# Patient Record
Sex: Female | Born: 1955
Health system: Southern US, Community
[De-identification: ages and names within clinical notes are randomized; demographics above are authoritative.]

## PROBLEM LIST (undated history)

## (undated) DIAGNOSIS — R87619 Unspecified abnormal cytological findings in specimens from cervix uteri: Secondary | ICD-10-CM

## (undated) DIAGNOSIS — G4700 Insomnia, unspecified: Secondary | ICD-10-CM

## (undated) HISTORY — DX: Unspecified abnormal cytological findings in specimens from cervix uteri: R87.619

## (undated) HISTORY — DX: Insomnia, unspecified: G47.00

## (undated) HISTORY — PX: COLPOSCOPY: SHX161

## (undated) HISTORY — PX: GYNECOLOGIC CRYOSURGERY: SHX857

---

## 2000-08-01 ENCOUNTER — Ambulatory Visit (HOSPITAL_BASED_OUTPATIENT_CLINIC_OR_DEPARTMENT_OTHER): Admission: RE | Admit: 2000-08-01 | Discharge: 2000-08-01 | Payer: Self-pay | Admitting: Orthopedic Surgery

## 2001-08-20 ENCOUNTER — Other Ambulatory Visit: Admission: RE | Admit: 2001-08-20 | Discharge: 2001-08-20 | Payer: Self-pay | Admitting: Obstetrics and Gynecology

## 2001-09-18 ENCOUNTER — Ambulatory Visit (HOSPITAL_COMMUNITY): Admission: RE | Admit: 2001-09-18 | Discharge: 2001-09-18 | Payer: Self-pay | Admitting: Obstetrics and Gynecology

## 2001-09-18 ENCOUNTER — Encounter (INDEPENDENT_AMBULATORY_CARE_PROVIDER_SITE_OTHER): Payer: Self-pay

## 2005-08-25 ENCOUNTER — Ambulatory Visit (HOSPITAL_COMMUNITY): Admission: RE | Admit: 2005-08-25 | Discharge: 2005-08-25 | Payer: Self-pay | Admitting: Family Medicine

## 2008-10-13 HISTORY — PX: LAPAROSCOPIC ASSISTED VAGINAL HYSTERECTOMY: SHX5398

## 2009-03-24 ENCOUNTER — Emergency Department (HOSPITAL_COMMUNITY): Admission: EM | Admit: 2009-03-24 | Discharge: 2009-03-24 | Payer: Self-pay | Admitting: Emergency Medicine

## 2009-09-16 ENCOUNTER — Ambulatory Visit (HOSPITAL_COMMUNITY): Admission: RE | Admit: 2009-09-16 | Discharge: 2009-09-16 | Payer: Self-pay | Admitting: Internal Medicine

## 2010-03-31 ENCOUNTER — Encounter: Admission: RE | Admit: 2010-03-31 | Discharge: 2010-03-31 | Payer: Self-pay | Admitting: Unknown Physician Specialty

## 2010-03-31 HISTORY — PX: BREAST BIOPSY: SHX20

## 2010-06-11 IMAGING — CT CT CHEST W/ CM
2 of 3 series · 15 of 36 positions shown, 18 images · IV contrast (Omnipaque 300)
Comparison: None

CLINICAL DATA: Right sternoclavicular region mass.

CT CHEST WITH CONTRAST
TECHNIQUE: Multidetector CT imaging of the chest was performed
following the standard protocol during bolus administration of
intravenous contrast.
Contrast: 80 ml Hmnipaque-611

[Series 2: chestroutine 5.0 b40f · axial · 0.60mm/px · z∈[-350,-110]mm · 12 of 58 slices shown, 15 images]
[im 5/58  mediastinal]
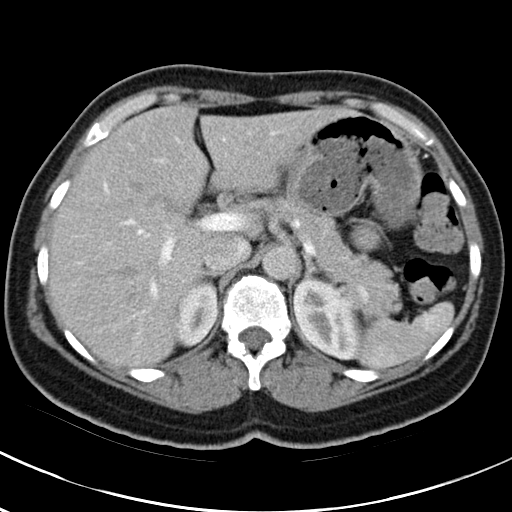
[im 5/58  lung]
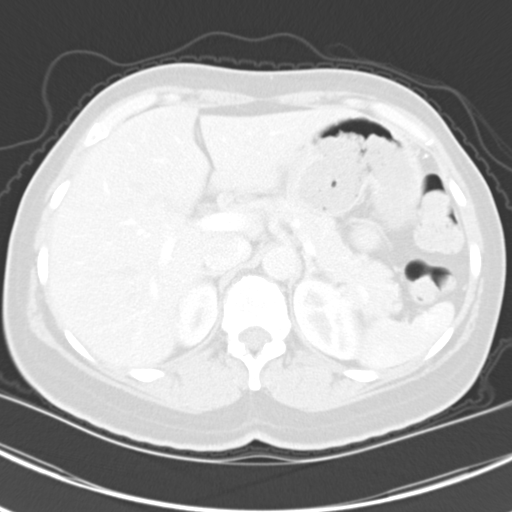
[im 9/58  lung]
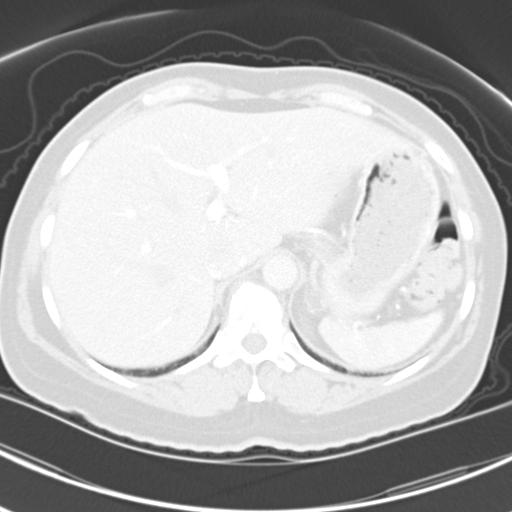
[im 13/58  lung]
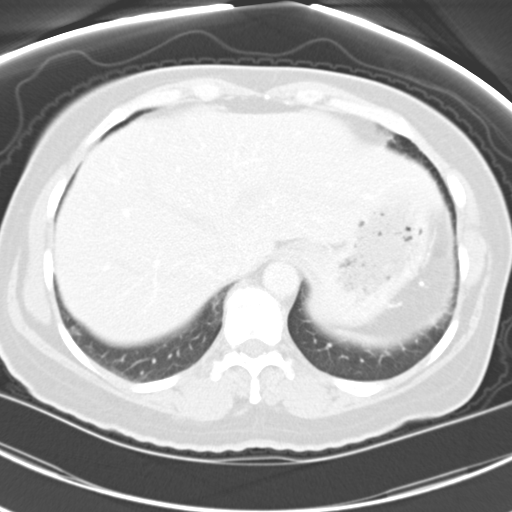
[im 17/58  lung]
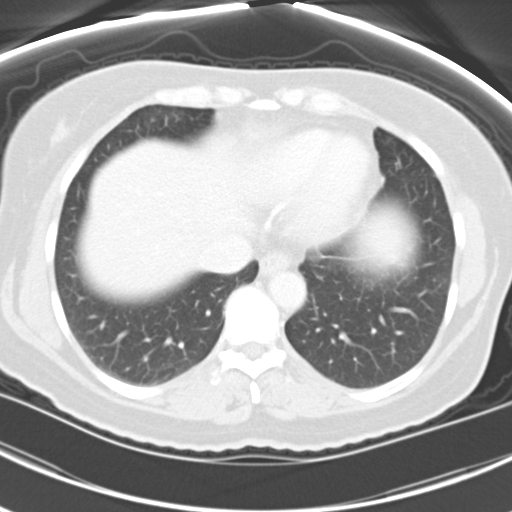
[im 22/58  mediastinal]
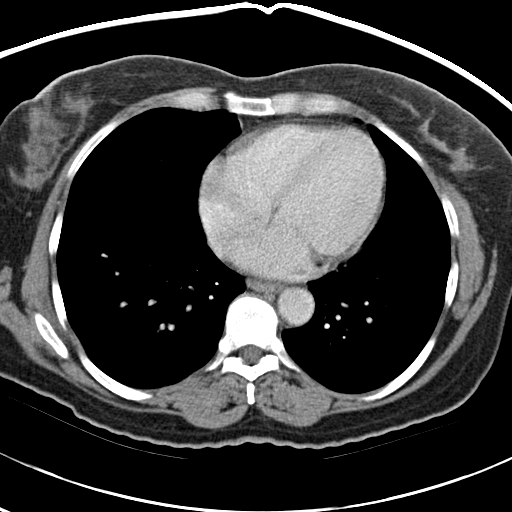
[im 22/58  lung]
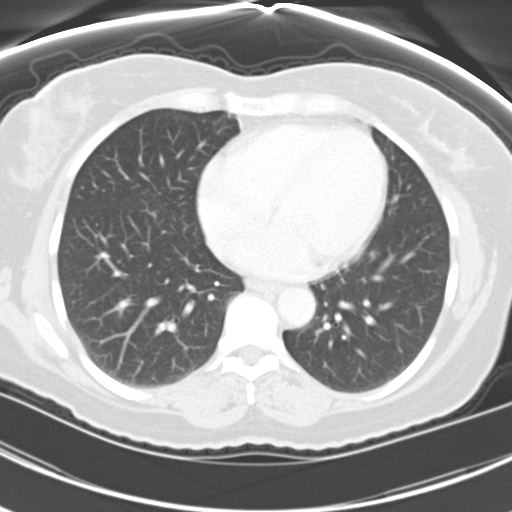
[im 26/58  lung]
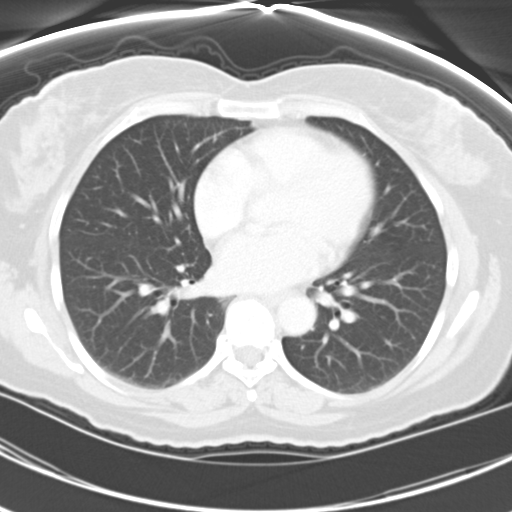
[im 32/58  lung]
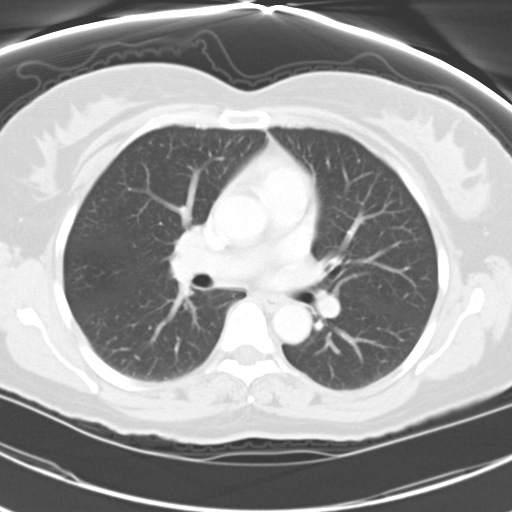
[im 36/58  lung]
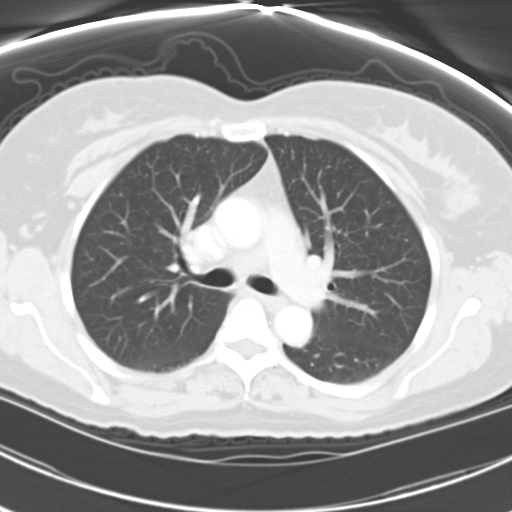
[im 41/58  mediastinal]
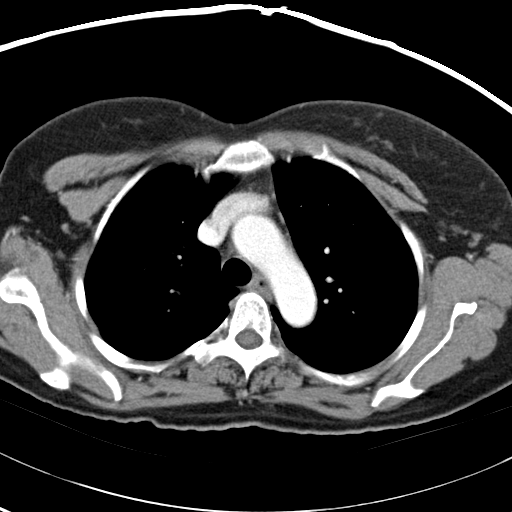
[im 41/58  lung]
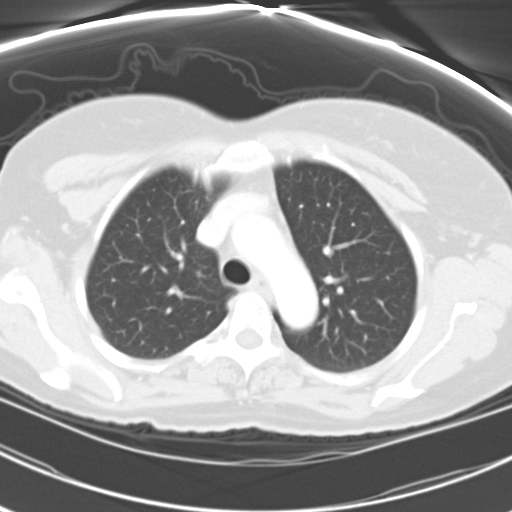
[im 45/58  lung]
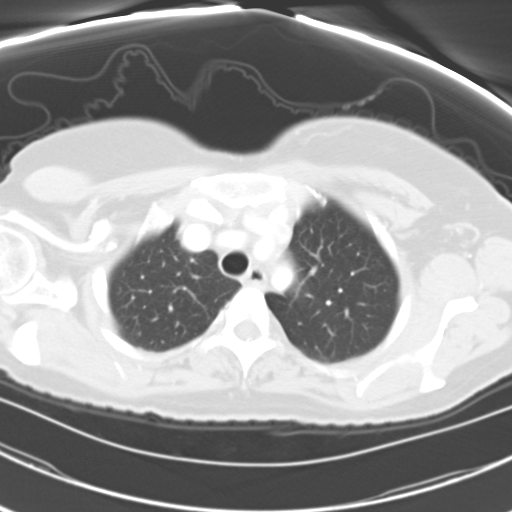
[im 49/58  lung]
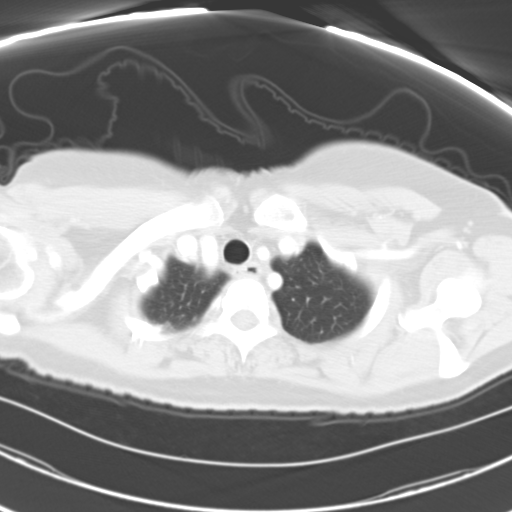
[im 53/58  lung]
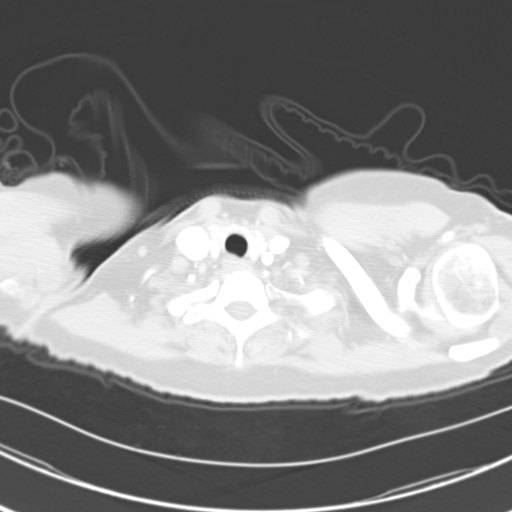

[Series 4: mpr coronal chest 3mm · coronal · 0.54mm/px · 3 of 71 slices shown]
[im 15/71  lung]
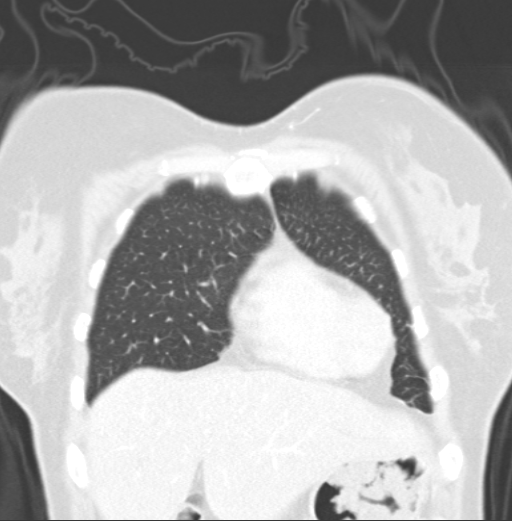
[im 29/71  lung]
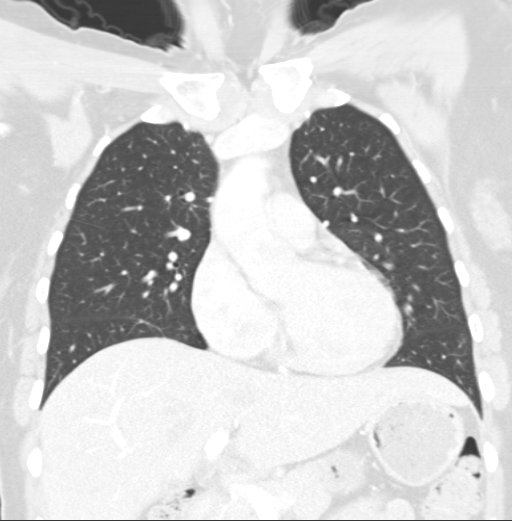
[im 43/71  lung]
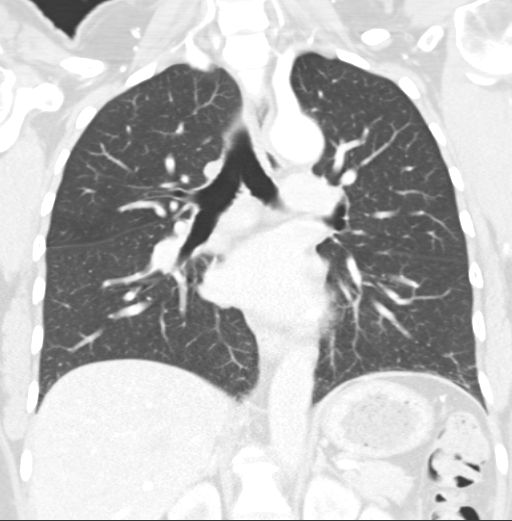

[15 of 36 positions shown; findings below may reference images not displayed]

FINDINGS: There is no pleural or pericardial fluid.  There is no
mediastinal or hilar mass or adenopathy.  The lung parenchyma is
clear.  Upper abdominal structures appear normal.

I do not see a any asymmetry or pathologic finding at the
sternoclavicular joints.  Clavicles themselves appear normal.  I do
not see any hypertrophic change of the joints.  No regional soft
tissue abnormalities are discernible.
IMPRESSION: Normal examination.  I cannot see any abnormality by imaging
relating to the sternoclavicular joints.  They appear symmetric and
grossly within normal limits.

## 2011-01-11 LAB — CREATININE, SERUM
Creatinine, Ser: 0.67 mg/dL (ref 0.4–1.2)
GFR calc Af Amer: >60 mL/min (ref >60)
GFR calc non Af Amer: >60 mL/min (ref >60)

## 2011-02-25 NOTE — Op Note (Signed)
[place]  Patient:    Cindy Noble, Cindy Noble                          MRN: [id number] Proc. Date: 08/01/00 Adm. Date:  [id number] Attending:  Twana First                           Operative Report  PREOPERATIVE DIAGNOSES: 1. Right shoulder adhesive capsulitis. 2. Right shoulder rotator cuff tendinitis and impingement.  POSTOPERATIVE DIAGNOSES: 1. Right shoulder adhesive capsulitis. 2. Right shoulder rotator cuff tendinitis and impingement.  PROCEDURE: 1. Right shoulder EUA followed by manipulation. 2. Right shoulder arthroscopic lysis of adhesions. 3. Right shoulder subacromial decompression.  SURGEON:  Elana Alm. Thurston Hole, M.D.  ASSISTANT:  ______ P.A.  ANESTHESIA:  General.  OPERATIVE TIME:  30 minutes.  COMPLICATIONS:  None.  INDICATIONS FOR PROCEDURE:  Cindy Noble is a 55 year old woman who has had 5 to 6 months of increasing right shoulder pain and stiffness with adhesive capsulitis and tendinitis who has failed conservative care, and is now to undergo EUM manipulation and arthroscopy.  DESCRIPTION OF PROCEDURE:  Cindy Noble was brought to the operating room on August 01, 2000.  Placed on the operating room table in supine position after a supraclavicular block had been placed in the holding room.  After being placed under general anesthesia, her right shoulder was examined under anesthesia.  Initial range of motion showed forward flexion 150, abduction to 110, internal and external rotation of 50 degrees.  A gentle manipulation was carried out, breaking up soft adhesions and improving flexion to 170, abduction to 170, internal and external rotation of 85 to 90 degrees.  Her shoulder remained stable to ligamentous exam.  After this was done she was placed in a beach chair position, and her shoulder and arm was prepped using sterile Betadine and draped using sterile technique.  Originally, through a posterior arthroscopic portal the  arthroscope with a pump attached was placed into an anterior portal, and arthroscopic probe was placed.  On initial inspection the articular cartilage and the glenohumeral joint was found to be normal.  Anterior labrum was intact.  Anterior and inferior labrum, and anterior inferior glenohumeral ligaments were intact.  Superior labrum and biceps tendon anchor was intact.  Biceps tendon was intact.  There was moderate hemorrhagic synovitis which was debrided anteriorly.  The posterior labrum was intact.  The inferior capsular recess had hemorrhagic synovitis secondary to manipulation, but no loose bodies.  The rotator cuff was thoroughly inspected, and there was found to be no evidence of a tear.  The subacromial space was entered.  Moderately thickened bursitis was resected. The rotator cuff underneath this was frayed and moderately edematous, but no evidence of a tear.  Subacromial decompression was carried out, removing 6 to 8 mm of the undersurface of the anterior, anterolateral, and anteromedial acromion, and CA ligament release carried out.  After this was done, the shoulder could be brought through a full range of motion with no impingement on the rotator cuff.  At this point it was felt that all pathology had been satisfactorily addressed.  The instruments were removed.  Portals were closed with 3-0 Nylon suture and injected with 0.25% Marcaine with epinephrine. Sterile dressings were applied and sling.  The patient was awakened and taken to the recovery room in stable condition.  FOLLOWUP CARE:  Cindy Noble will be followed as  an outpatient on Vicodin and Celebrex.  Begin early aggressive physical therapy.  See her back in the office in a week for sutures out and follow-up.DD:  08/01/00 TD:  08/01/00 Job: [id number]

## 2011-02-25 NOTE — H&P (Signed)
[facility]  Patient:    Cindy Noble, Cindy Noble Visit Number: [id number] MRN: [id number]          Service Type: Attending:  Esmeralda Arthur, M.D. Dictated by:   Esmeralda Arthur, M.D.                           History and Physical  HISTORY OF PRESENT ILLNESS:   This is a 55 year old, para 1-0-1, who is admitted to the hospital for hysteroscopy, D&C, and possible resection.  She was seen on August 20, 2001, for a checkup.  She was in good health and she is taking Ortho-Cyclen.  Because she is taking birth control pills, she had an ultrasound and she had a finding that her uterus was 5.1 x 3.6 x 4 cm with the endometrium 6 mm in thickness with a polyp which was outlined by fluid, 6 x 2 mm.  Her right ovary was 2.7 x 1.8 x 1.7 with a simple cyst, 9 x 9 mm. The left ovary was 1.4 x 1 x 1.5.  Because of the polyps, she elected to have the hysteroscopy which we are doing today.                                Her last Papanicolaou smear was on August 24, 2001, and was normal.  She has had previous dysplasia of the cervix and, in 1983, had cryosurgery.                                Her last menstrual period was March 2002.  She does not smoke.  She uses alcohol occasionally.  She rarely uses caffeine. She does take Ortho-Cyclen birth control pill.  She is on Caltrate.  REVIEW OF SYSTEMS:            Negative except she has some arthritis.  PAST SURGICAL HISTORY:        She had a C section.  She has had right shoulder surgery.  FAMILY HISTORY:               She has two aunts with breast cancer.  She had a maternal aunt with ovarian cancer, and her father had hypertension.  She has no problems.  PAST PERSONAL HISTORY:        Negative.  PHYSICAL EXAMINATION:  GENERAL:                      A well-developed, well-nourished female who is oriented and alert.  VITAL SIGNS:                  Blood pressure 104/74, weight 137.  Her height is is 5 feet, 1  inch.  HEART:                        Normal sinus rhythm without any gallops or murmurs.  LUNGS:                        Clear to percussion and auscultation.  BREASTS:                      Type 4 without masses.  ABDOMEN:  The liver and spleen are not palpated.  The abdomen is flat.  GENITOURINARY:                External genitalia within normal limits.  Cervix looks fine, is epithelialized.  The uterus feels slightly enlarged and the ovaries are not palpable.  Rectovaginal confirms.  IMPRESSION:                   Perimenopausal on hormone replacement therapy, endometrial polyp, endometrial fluid, dysplasia of the cervix in 1983 which was treated with cryosurgery, two maternal aunts with breast cancer, one maternal aunt with ovarian cancer.  DISPOSITION:                  Admit to the hospital for hysteroscopy and dilation and curettage. Dictated by:   Esmeralda Arthur, M.D. Attending:  Esmeralda Arthur, M.D. DD:  09/17/01 TD:  09/17/01 Job: [phone] [id number]

## 2011-02-25 NOTE — Op Note (Signed)
[facility]  Patient:    Cindy Noble, Cindy Noble Visit Number: [id number] MRN: [id number]          Service Type: DSU Location: [facility] Attending Physician:  Amanda Cockayne Dictated by:   Esmeralda Arthur, M.D. Proc. Date: 09/18/01 Admit Date:  09/18/2001                             Operative Report  PREOPERATIVE DIAGNOSIS:       Cervical stenosis, endometrial polyp, fluid in endometrial cavity.  POSTOPERATIVE DIAGNOSIS:      Cervical stenosis, endometrial polyp, fluid in endometrial cavity. Plus perforation of the uterus.  OPERATION:  SURGEON:                      Esmeralda Arthur, M.D.  ASSISTANT:  ANESTHESIA:                   General anesthesia.  PACKS:                        None.  CATHETERS:                    None.  MEDIUM:                       Sorbitol; deficit 140 cc.  FINDINGS:                     She had marked cervical stenosis which had to be dilated with the hemostat.  Once we dilated, we then sounded the uterus and on dilating the cervix, I felt we perforated the uterus.  DESCRIPTION OF PROCEDURE:  We dilated through the observation scope and then inserted the observation scope and I could not see a perforation, but I am sure we had one because of the fluid loss.  She had no large endometrial polyps.  She had several small ones in the posterior wall and anterior wall. I could see both tubal openings.  We then did a curettage with the smooth curet and removed the endometrial polyp on the left side, probably a polyp on the anterior side and these were small.  We then stopped the procedure and did not look again.  We watched her for a couple of minutes and she had no excessive bleeding.  The procedure was terminated and she was carried to the recovery room in good condition. Dictated by:   Esmeralda Arthur, M.D. Attending Physician:  Amanda Cockayne DD:  09/18/01 TD:  09/18/01 Job: [id number]

## 2014-01-08 DEATH — deceased

## 2016-11-28 ENCOUNTER — Encounter: Payer: Self-pay | Admitting: Nurse Practitioner

## 2016-11-28 NOTE — Progress Notes (Deleted)
Patient ID: Cindy Noble, female   DOB: 04/09/1956, 61 y.o.   MRN: [id number]  61 y.o. G1P1001. Married  Caucasian Fe here for [employer] annual exam.  Prior pt of Dr. Winfred BurnSig Smith.  She had TVH 10/12/2006 due to abnormal pap - does not recall the exact pathology.  With hysterectomy took Premarin X 1 year - ended about 2009. No vaso symptoms. Some vagina dryness. Sleep patterns have always been disrupted.  Cant go to sleep.  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  Married for 25 yrs, son died at age 62 in MVA.   Lapse of GYN care.  She does get labs at work.  Patient's last menstrual period was 10/10/2006 (approximate).          Sexually active: Yes.    The current method of family planning is status post hysterectomy and post menopausal status.    Exercising: No.  The patient does not participate in regular exercise at present. Smoker:  no  Health Maintenance: Pap:  Prior to 2008 - will do today MMG: 08/2014, normal per patient, normal since biopsy in 2011 - will get  scheduled Colonoscopy: Never - will get scheduled BMD: Never - will get scheduled TDaP:  > 10 years - will update Shingles: Never - will get at pharmacy Pneumonia: Not indicated due to age Hep C and HIV: blood donation 121/20/17 Labs: Wellness Program, within normal parameters  10/12017   reports that she has never smoked. She has never used smokeless tobacco. She reports that she drinks about 0.6 oz of alcohol per week . She reports that she does not use drugs.  Past Medical History:  Diagnosis Date  . Insomnia     Past Surgical History:  Procedure Laterality Date  . BREAST BIOPSY Left 03/31/2010    core biopsy:  fibrocystic changes with adenosis and stromal fibrosis  . CESAREAN SECTION  01/13/1972  . COLPOSCOPY  mid 80's   dysplasia, pap every 6 months for years per patient  . GYNECOLOGIC CRYOSURGERY  mid 80's  . LAPAROSCOPIC ASSISTED VAGINAL HYSTERECTOMY  2008   ovaries remain,  secondary to abnormal pap  "suspicious cells"    No current outpatient prescriptions on file.   No current facility-administered medications for this visit.     Family History  Problem Relation Age of Onset  . Aneurysm Mother   . Diabetes Father   . Hyperlipidemia Father   . Heart attack Father   . Heart disease Father   . Ovarian cancer Maternal Aunt   . Diabetes Paternal Aunt   . Aneurysm Maternal Grandfather   . Diabetes Paternal Grandmother   . Heart attack Paternal Grandmother   . Breast cancer Maternal Aunt   . Breast cancer Maternal Aunt   . Aneurysm Maternal Aunt   . Diabetes Sister   . Hyperlipidemia Sister     ROS:  Pertinent items are noted in HPI.  Otherwise, a comprehensive ROS was negative.  Exam:   BP 118/76 (BP Location: Right Arm, Patient Position: Sitting, Cuff Size: Normal)   Pulse 80   Ht 5\' 1"  (1.549 m)   Wt 148 lb (67.1 kg)   LMP 10/10/2006 (Approximate)   BMI 27.96 kg/m  Height: 5\' 1"  (154.9 cm) Ht Readings from Last 3 Encounters:  11/29/16 5\' 1"  (1.549 m)    General appearance: alert, cooperative and appears stated age Head: Normocephalic, without obvious abnormality, atraumatic Neck: no adenopathy, supple, symmetrical, trachea midline and thyroid normal to inspection  and palpation Lungs: clear to auscultation bilaterally Breasts: normal appearance, no masses or tenderness, very dense Heart: regular rate and rhythm Abdomen: soft, non-tender; no masses,  no organomegaly Extremities: extremities normal, atraumatic, no cyanosis or edema Skin: Skin color, texture, turgor normal. No rashes or lesions. 2 areas on chest wall that need to be evaluated by dermatology. Lymph nodes: Cervical, supraclavicular, and axillary nodes normal. No abnormal inguinal nodes palpated Neurologic: Grossly normal   Pelvic: External genitalia:  no lesions              Urethra:  normal appearing urethra with no masses, tenderness or lesions              Bartholin's and Skene's: normal                  Vagina: normal appearing vagina with normal color and discharge, no lesions              Cervix: absent              Pap taken: Yes.   Bimanual Exam:  Uterus:  uterus absent              Adnexa: no mass, fullness, tenderness               Rectovaginal: Confirms               Anus:  normal sphincter tone, no lesions  Chaperone present: yes  A:  Well Woman with normal exam  Lapse of GYN care  S/P TVH 10/12/2006 secondary to abnormal pap  S/P left breast core biopsy 03/31/10 with FCB changes including adenosis and stromal fibrosis.    S/P Endo biopsy with disordered proliferative endo and polyp  Update immunizations  Update of routine screening  Atrophic vaginitis  Skin changes on chest from sun exposure - she will call herself and get apt with Dr. Emily Filbert whom she has seen in the past.  P:   Reviewed health and wellness pertinent to exam  Pap smear was done  Mammogram is due and will schedule along with BMD at [facility]  Will follow with labs  Will try to get pathology from [facility] 2008 from [facility]  Counseled on breast self exam, mammography screening, adequate intake of calcium and vitamin D, diet and exercise, Kegel's exercises return annually or prn  An After Visit Summary was printed and given to the patient.

## 2016-11-29 ENCOUNTER — Ambulatory Visit (INDEPENDENT_AMBULATORY_CARE_PROVIDER_SITE_OTHER): Payer: BLUE CROSS/BLUE SHIELD | Admitting: Nurse Practitioner

## 2016-11-29 ENCOUNTER — Encounter: Payer: Self-pay | Admitting: Nurse Practitioner

## 2016-11-29 VITALS — BP 118/76 | HR 80 | Ht 61.0 in | Wt 148.0 lb

## 2016-11-29 DIAGNOSIS — E559 Vitamin D deficiency, unspecified: Secondary | ICD-10-CM | POA: Diagnosis not present

## 2016-11-29 DIAGNOSIS — Z0000 Encounter for general adult medical examination without abnormal findings: Secondary | ICD-10-CM

## 2016-11-29 DIAGNOSIS — Z1211 Encounter for screening for malignant neoplasm of colon: Secondary | ICD-10-CM

## 2016-11-29 DIAGNOSIS — Z1272 Encounter for screening for malignant neoplasm of vagina: Secondary | ICD-10-CM | POA: Diagnosis not present

## 2016-11-29 DIAGNOSIS — Z23 Encounter for immunization: Secondary | ICD-10-CM

## 2016-11-29 DIAGNOSIS — Z1151 Encounter for screening for human papillomavirus (HPV): Secondary | ICD-10-CM | POA: Diagnosis not present

## 2016-11-29 DIAGNOSIS — Z01411 Encounter for gynecological examination (general) (routine) with abnormal findings: Secondary | ICD-10-CM

## 2016-11-29 NOTE — Progress Notes (Signed)
[facility] [place] [phone] ex. [phone] to schedule BMD and 3D MMG while in office. Message left with scheduler to return call to [place] at [phone] for scheduling patient. Advised patient will return call with appointment information, best contact [phone].   [facility]ait[facility]. Patient scheduled for BMD 12/29/16 at 0800, MMG 3/1 at 0730. Have patient arrive 15 min early, bring list of medications for BMD and wear elastic waistband, no metal. Fax order to [phone].  Spoke with patient, advised as seen above. Patient agreeable to date and time.

## 2016-11-29 NOTE — Patient Instructions (Addendum)

## 2016-11-30 LAB — VITAMIN D 25 HYDROXY (VIT D DEFICIENCY, FRACTURES): Vit D, 25-Hydroxy: 31 ng/mL (ref 30–100)

## 2016-12-01 LAB — IPS PAP TEST WITH HPV

## 2016-12-01 NOTE — Progress Notes (Signed)
Encounter reviewed by Dr. Brook Amundson C. Silva.  

## 2016-12-02 ENCOUNTER — Encounter: Payer: Self-pay | Admitting: Nurse Practitioner

## 2016-12-02 NOTE — Progress Notes (Signed)
Patient ID: Cindy Noble, female   DOB: 04/09/1956, 61 y.o.   MRN: [id number]  61 y.o. G1P1001. Married  Caucasian Fe here for [employer] annual exam.  Prior pt of Dr. Winfred BurnSig Smith.  She had TVH 10/12/2006 due to abnormal pap - does not recall the exact pathology.  With hysterectomy took Premarin X 1 year - ended about 2009. No vaso symptoms. Some vagina dryness. Sleep patterns have always been disrupted.  Cant go to sleep.  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  Married for 25 yrs, son died at age 62 in MVA.   Lapse of GYN care.  She does get labs at work.  Patient's last menstrual period was 10/10/2006 (approximate).          Sexually active: Yes.    The current method of family planning is status post hysterectomy and post menopausal status.    Exercising: No.  The patient does not participate in regular exercise at present. Smoker:  no  Health Maintenance: Pap:  Prior to 2008 - will do today MMG: 08/2014, normal per patient, normal since biopsy in 2011 - will get  scheduled Colonoscopy: Never - will get scheduled BMD: Never - will get scheduled TDaP:  > 10 years - will update Shingles: Never - will get at pharmacy Pneumonia: Not indicated due to age Hep C and HIV: blood donation 121/20/17 Labs: Wellness Program, within normal parameters  10/12017   reports that she has never smoked. She has never used smokeless tobacco. She reports that she drinks about 0.6 oz of alcohol per week . She reports that she does not use drugs.  Past Medical History:  Diagnosis Date  . Insomnia     Past Surgical History:  Procedure Laterality Date  . BREAST BIOPSY Left 03/31/2010    core biopsy:  fibrocystic changes with adenosis and stromal fibrosis  . CESAREAN SECTION  01/13/1972  . COLPOSCOPY  mid 80's   dysplasia, pap every 6 months for years per patient  . GYNECOLOGIC CRYOSURGERY  mid 80's  . LAPAROSCOPIC ASSISTED VAGINAL HYSTERECTOMY  2008   ovaries remain,  secondary to abnormal pap  "suspicious cells"    No current outpatient prescriptions on file.   No current facility-administered medications for this visit.     Family History  Problem Relation Age of Onset  . Aneurysm Mother   . Diabetes Father   . Hyperlipidemia Father   . Heart attack Father   . Heart disease Father   . Ovarian cancer Maternal Aunt   . Diabetes Paternal Aunt   . Aneurysm Maternal Grandfather   . Diabetes Paternal Grandmother   . Heart attack Paternal Grandmother   . Breast cancer Maternal Aunt   . Breast cancer Maternal Aunt   . Aneurysm Maternal Aunt   . Diabetes Sister   . Hyperlipidemia Sister     ROS:  Pertinent items are noted in HPI.  Otherwise, a comprehensive ROS was negative.  Exam:   BP 118/76 (BP Location: Right Arm, Patient Position: Sitting, Cuff Size: Normal)   Pulse 80   Ht 5\' 1"  (1.549 m)   Wt 148 lb (67.1 kg)   LMP 10/10/2006 (Approximate)   BMI 27.96 kg/m  Height: 5\' 1"  (154.9 cm) Ht Readings from Last 3 Encounters:  11/29/16 5\' 1"  (1.549 m)    General appearance: alert, cooperative and appears stated age Head: Normocephalic, without obvious abnormality, atraumatic Neck: no adenopathy, supple, symmetrical, trachea midline and thyroid normal to inspection  and palpation Lungs: clear to auscultation bilaterally Breasts: normal appearance, no masses or tenderness, very dense Heart: regular rate and rhythm Abdomen: soft, non-tender; no masses,  no organomegaly Extremities: extremities normal, atraumatic, no cyanosis or edema Skin: Skin color, texture, turgor normal. No rashes or lesions. 2 areas on chest wall that need to be evaluated by dermatology. Lymph nodes: Cervical, supraclavicular, and axillary nodes normal. No abnormal inguinal nodes palpated Neurologic: Grossly normal   Pelvic: External genitalia:  no lesions              Urethra:  normal appearing urethra with no masses, tenderness or lesions              Bartholin's and Skene's: normal                  Vagina: normal appearing vagina with normal color and discharge, no lesions              Cervix: absent              Pap taken: Yes.   Bimanual Exam:  Uterus:  uterus absent              Adnexa: no mass, fullness, tenderness               Rectovaginal: Confirms               Anus:  normal sphincter tone, no lesions  Chaperone present: yes  A:  Well Woman with normal exam  Lapse of GYN care  S/P TVH 10/12/2006 secondary to abnormal pap  S/P left breast core biopsy 03/31/10 with FCB changes including adenosis and stromal fibrosis.    S/P Endo biopsy with disordered proliferative endo and polyp  Update immunizations  Update of routine screening  Atrophic vaginitis  Skin changes on chest from sun exposure - she will call herself and get apt with Dr. Emily Filbert whom she has seen in the past.  P:   Reviewed health and wellness pertinent to exam  Pap smear was done  Mammogram is due and will schedule along with BMD at [facility]  Will follow with labs  Will try to get pathology from [facility] 2008 from [facility]  Counseled on breast self exam, mammography screening, adequate intake of calcium and vitamin D, diet and exercise, Kegel's exercises return annually or prn  An After Visit Summary was printed and given to the patient.

## 2016-12-08 DIAGNOSIS — Z1231 Encounter for screening mammogram for malignant neoplasm of breast: Secondary | ICD-10-CM | POA: Diagnosis not present

## 2017-01-03 DIAGNOSIS — Z1211 Encounter for screening for malignant neoplasm of colon: Secondary | ICD-10-CM | POA: Diagnosis not present

## 2017-01-05 ENCOUNTER — Encounter: Payer: Self-pay | Admitting: Nurse Practitioner

## 2017-01-23 DIAGNOSIS — Z1211 Encounter for screening for malignant neoplasm of colon: Secondary | ICD-10-CM | POA: Diagnosis not present

## 2017-01-23 DIAGNOSIS — K5730 Diverticulosis of large intestine without perforation or abscess without bleeding: Secondary | ICD-10-CM | POA: Diagnosis not present

## 2017-03-23 DIAGNOSIS — E2839 Other primary ovarian failure: Secondary | ICD-10-CM | POA: Diagnosis not present

## 2017-03-23 DIAGNOSIS — M8589 Other specified disorders of bone density and structure, multiple sites: Secondary | ICD-10-CM | POA: Diagnosis not present

## 2017-03-23 DIAGNOSIS — M85851 Other specified disorders of bone density and structure, right thigh: Secondary | ICD-10-CM | POA: Diagnosis not present

## 2017-03-29 ENCOUNTER — Telehealth: Payer: Self-pay | Admitting: Nurse Practitioner

## 2017-03-29 NOTE — Telephone Encounter (Signed)
Please let pt know that BMD done on 03/23/17 shows a T Score at the spine of -1.7; right hip neck at -1.8; left hip neck at -1.6.  The lowest is the right hip but all sites fall in the Osteopenic range.  While some bone loss is normal and expectant in post menopausal women, we do not want to see further loss.  Currently she has no exercise program - she needs to walk at least 3-4 times a week.  She needs to use upper body weights as well. She needs to be on 1000 IU Vit D  daily as previous instructed and maintain good calcium support.  Needs a recheck in 2 yrs.

## 2017-03-30 NOTE — Telephone Encounter (Signed)
Patient returning your call.

## 2017-03-30 NOTE — Telephone Encounter (Signed)
Results discussed in detail with patient.

## 2017-03-30 NOTE — Telephone Encounter (Signed)
Called patient, no answer, left message to call me back at [phone]

## 2017-12-01 ENCOUNTER — Ambulatory Visit: Payer: BLUE CROSS/BLUE SHIELD | Admitting: Certified Nurse Midwife

## 2017-12-22 ENCOUNTER — Ambulatory Visit: Payer: BLUE CROSS/BLUE SHIELD | Admitting: Certified Nurse Midwife

## 2017-12-22 ENCOUNTER — Other Ambulatory Visit: Payer: Self-pay

## 2017-12-22 ENCOUNTER — Encounter: Payer: Self-pay | Admitting: Certified Nurse Midwife

## 2017-12-22 VITALS — BP 108/68 | HR 64 | Resp 16 | Ht 61.0 in | Wt 138.0 lb

## 2017-12-22 DIAGNOSIS — Z01419 Encounter for gynecological examination (general) (routine) without abnormal findings: Secondary | ICD-10-CM

## 2017-12-22 DIAGNOSIS — L578 Other skin changes due to chronic exposure to nonionizing radiation: Secondary | ICD-10-CM

## 2017-12-22 DIAGNOSIS — Z780 Asymptomatic menopausal state: Secondary | ICD-10-CM | POA: Diagnosis not present

## 2017-12-22 DIAGNOSIS — N952 Postmenopausal atrophic vaginitis: Secondary | ICD-10-CM

## 2017-12-22 NOTE — Patient Instructions (Signed)

## 2017-12-22 NOTE — Progress Notes (Signed)
62 y.o. G8P1000 Married  Caucasian Fe here for annual exam. Post menopausal no HRT. Occasional  Vaginal dryness, no issues. All menopausal symptoms resolved. Past history of insomnia, much better. No health issues today. Sees Urgent care if needed. Plans mammogram soon and trip to beach!  Patient's last menstrual period was 10/10/2006 (approximate).          Sexually active: Yes.    The current method of family planning is status post hysterectomy.    Exercising: No.  exercise Smoker:  no  Health Maintenance: Pap:  11-29-16 neg HPV HR neg History of Abnormal Pap: yes MMG:  12-08-16 category c density birads 1:neg.  Self Breast exams: yes Colonoscopy:  3/18  F/u 56yrs per patient BMD:   2018 normal TDaP:  3/18 Shingles: no Pneumonia: no Hep C and HIV: not done Labs: if needed Flu vaccine: no   reports that  has never smoked. she has never used smokeless tobacco. She reports that she drinks alcohol. She reports that she does not use drugs.  Past Medical History:  Diagnosis Date  . Insomnia     Past Surgical History:  Procedure Laterality Date  . BREAST BIOPSY Left 03/31/2010    core biopsy:  fibrocystic changes with adenosis and stromal fibrosis  . CESAREAN SECTION  01/13/1972  . COLPOSCOPY  mid 80's   dysplasia, pap every 6 months for years per patient  . GYNECOLOGIC CRYOSURGERY  mid 80's  . LAPAROSCOPIC ASSISTED VAGINAL HYSTERECTOMY  10/13/2008   ovaries remain,  secondary to hyperplasia with atypia, endo polyp    No current outpatient medications on file.   No current facility-administered medications for this visit.     Family History  Problem Relation Age of Onset  . Aneurysm Mother   . Diabetes Father   . Hyperlipidemia Father   . Heart attack Father   . Heart disease Father   . Ovarian cancer Maternal Aunt   . Diabetes Paternal Aunt   . Aneurysm Maternal Grandfather   . Diabetes Paternal Grandmother   . Heart attack Paternal Grandmother   . Breast cancer  Maternal Aunt   . Breast cancer Maternal Aunt   . Aneurysm Maternal Aunt   . Diabetes Sister   . Hyperlipidemia Sister     ROS:  Pertinent items are noted in HPI.  Otherwise, a comprehensive ROS was negative.  Exam:   BP 108/68   Pulse 64   Resp 16   Ht 5\' 1"  (1.549 m)   Wt 138 lb (62.6 kg)   LMP 10/10/2006 (Approximate)   BMI 26.07 kg/m  Height: 5\' 1"  (154.9 cm) Ht Readings from Last 3 Encounters:  12/22/17 5\' 1"  (1.549 m)  11/29/16 5\' 1"  (1.549 m)    General appearance: alert, cooperative and appears stated age Head: Normocephalic, without obvious abnormality, atraumatic Neck: no adenopathy, supple, symmetrical, trachea midline and thyroid normal to inspection and palpation Lungs: clear to auscultation bilaterally Breasts: normal appearance, no masses or tenderness, No nipple retraction or dimpling, No nipple discharge or bleeding, No axillary or supraclavicular adenopathy Heart: regular rate and rhythm Abdomen: soft, non-tender; no masses,  no organomegaly Extremities: extremities normal, atraumatic, no cyanosis or edema Skin: Skin color, texture, turgor normal. No rashes or lesions Lymph nodes: Cervical, supraclavicular, and axillary nodes normal. No abnormal inguinal nodes palpated Neurologic: Grossly normal   Pelvic: External genitalia:  no lesions              Urethra:  normal appearing urethra with  no masses, tenderness or lesions              Bartholin's and Skene's: normal                 Vagina: normal appearing vagina with normal color and discharge, no lesions              Cervix: absent              Pap taken: No. Bimanual Exam:  Uterus:  uterus absent              Adnexa: normal adnexa and no mass, fullness, tenderness               Rectovaginal: Confirms               Anus:  normal sphincter tone, no lesions  Chaperone present: yes  A:  Well Woman with normal exam  Menopausal no HRT S/P TAH with ovaries retained abnormal pap smear only, no  cancer  Atrophic vaginitis  Mammogram due  Sun damaged skin total body   P:   Reviewed health and wellness pertinent to exam  Discussed atrophic vaginitis and need to use moisture to decrease risk of UTI and irritation. Options discussed and she will decide and call if issues.  Stressed importance of yearly mammogram and SBE  Encouraged to do dermatology visit due to extent of sun damage noted. Given information  Pap smear: no   counseled on breast self exam, mammography screening, feminine hygiene, osteoporosis, adequate intake of calcium and vitamin D, diet and exercise  return annually or prn  An After Visit Summary was printed and given to the patient.

## 2018-02-01 DIAGNOSIS — Z1231 Encounter for screening mammogram for malignant neoplasm of breast: Secondary | ICD-10-CM | POA: Diagnosis not present

## 2018-02-12 ENCOUNTER — Encounter: Payer: Self-pay | Admitting: Certified Nurse Midwife

## 2019-08-30 ENCOUNTER — Other Ambulatory Visit: Payer: Self-pay

## 2019-08-30 DIAGNOSIS — Z20822 Contact with and (suspected) exposure to covid-19: Secondary | ICD-10-CM

## 2019-09-02 LAB — NOVEL CORONAVIRUS, NAA: SARS-CoV-2, NAA: NOT DETECTED

## 2019-12-12 ENCOUNTER — Encounter: Payer: Self-pay | Admitting: Certified Nurse Midwife

## 2019-12-12 DIAGNOSIS — Z1231 Encounter for screening mammogram for malignant neoplasm of breast: Secondary | ICD-10-CM | POA: Diagnosis not present

## 2019-12-14 ENCOUNTER — Ambulatory Visit: Payer: Self-pay | Attending: Internal Medicine

## 2019-12-14 DIAGNOSIS — Z23 Encounter for immunization: Secondary | ICD-10-CM | POA: Insufficient documentation

## 2019-12-14 NOTE — Progress Notes (Signed)
   Covid-19 Vaccination Clinic  Name:  Cindy Noble    MRN: [id number] DOB: 1956/09/29  12/14/2019  [name] was observed post Covid-19 immunization for 15 minutes without incident. She was provided with Vaccine Information Sheet and instruction to access the V-Safe system.   [name] was instructed to call 911 with any severe reactions post vaccine: Marland Kitchen Difficulty breathing  . Swelling of face and throat  . A fast heartbeat  . A bad rash all over body  . Dizziness and weakness   Immunizations Administered    Name Date Dose VIS Date Route   Moderna COVID-19 Vaccine 12/14/2019 11:12 AM 0.5 mL 09/10/2019 Intramuscular   Manufacturer: Moderna   Lot: [id number]   NDC: 37169-678-93

## 2019-12-31 ENCOUNTER — Encounter: Payer: Self-pay | Admitting: Certified Nurse Midwife

## 2020-01-15 ENCOUNTER — Ambulatory Visit: Payer: Self-pay | Attending: Internal Medicine

## 2020-01-15 DIAGNOSIS — Z23 Encounter for immunization: Secondary | ICD-10-CM

## 2020-01-15 NOTE — Progress Notes (Signed)
   Covid-19 Vaccination Clinic  Name:  Cindy Noble    MRN: [id number] DOB: 1955-12-03  01/15/2020  Cindy Noble was observed post Covid-19 immunization for 15 minutes without incident. She was provided with Vaccine Information Sheet and instruction to access the V-Safe system.   Cindy Noble was instructed to call 911 with any severe reactions post vaccine: Marland Kitchen Difficulty breathing  . Swelling of face and throat  . A fast heartbeat  . A bad rash all over body  . Dizziness and weakness   Immunizations Administered    Name Date Dose VIS Date Route   Moderna COVID-19 Vaccine 01/15/2020 10:21 AM 0.5 mL 09/10/2019 Intramuscular   Manufacturer: Gala Murdoch   Lot: [id number]   NDC: 47829-562-13

## 2020-03-23 DIAGNOSIS — B0052 Herpesviral keratitis: Secondary | ICD-10-CM | POA: Diagnosis not present

## 2020-03-24 DIAGNOSIS — B0052 Herpesviral keratitis: Secondary | ICD-10-CM | POA: Diagnosis not present

## 2020-03-27 DIAGNOSIS — B0052 Herpesviral keratitis: Secondary | ICD-10-CM | POA: Diagnosis not present

## 2020-04-03 DIAGNOSIS — B0052 Herpesviral keratitis: Secondary | ICD-10-CM | POA: Diagnosis not present

## 2020-09-17 DIAGNOSIS — Z0189 Encounter for other specified special examinations: Secondary | ICD-10-CM | POA: Diagnosis not present

## 2020-10-05 DIAGNOSIS — E663 Overweight: Secondary | ICD-10-CM | POA: Diagnosis not present

## 2020-10-05 DIAGNOSIS — E559 Vitamin D deficiency, unspecified: Secondary | ICD-10-CM | POA: Diagnosis not present

## 2020-10-08 DIAGNOSIS — E782 Mixed hyperlipidemia: Secondary | ICD-10-CM | POA: Diagnosis not present

## 2021-01-11 DIAGNOSIS — E782 Mixed hyperlipidemia: Secondary | ICD-10-CM | POA: Diagnosis not present

## 2021-03-16 DIAGNOSIS — H5203 Hypermetropia, bilateral: Secondary | ICD-10-CM | POA: Diagnosis not present

## 2021-09-10 DIAGNOSIS — E782 Mixed hyperlipidemia: Secondary | ICD-10-CM | POA: Diagnosis not present

## 2021-09-17 DIAGNOSIS — E782 Mixed hyperlipidemia: Secondary | ICD-10-CM | POA: Diagnosis not present

## 2021-09-17 DIAGNOSIS — Z0001 Encounter for general adult medical examination with abnormal findings: Secondary | ICD-10-CM | POA: Diagnosis not present

## 2021-12-27 DIAGNOSIS — Z1231 Encounter for screening mammogram for malignant neoplasm of breast: Secondary | ICD-10-CM | POA: Diagnosis not present

## 2022-03-15 DIAGNOSIS — D485 Neoplasm of uncertain behavior of skin: Secondary | ICD-10-CM | POA: Diagnosis not present

## 2022-03-15 DIAGNOSIS — C44729 Squamous cell carcinoma of skin of left lower limb, including hip: Secondary | ICD-10-CM | POA: Diagnosis not present

## 2022-03-24 DIAGNOSIS — C44729 Squamous cell carcinoma of skin of left lower limb, including hip: Secondary | ICD-10-CM | POA: Diagnosis not present

## 2022-03-31 DIAGNOSIS — C44729 Squamous cell carcinoma of skin of left lower limb, including hip: Secondary | ICD-10-CM | POA: Diagnosis not present

## 2022-04-07 DIAGNOSIS — C44729 Squamous cell carcinoma of skin of left lower limb, including hip: Secondary | ICD-10-CM | POA: Diagnosis not present

## 2022-04-20 DIAGNOSIS — L821 Other seborrheic keratosis: Secondary | ICD-10-CM | POA: Diagnosis not present

## 2022-04-20 DIAGNOSIS — L578 Other skin changes due to chronic exposure to nonionizing radiation: Secondary | ICD-10-CM | POA: Diagnosis not present

## 2022-04-20 DIAGNOSIS — L814 Other melanin hyperpigmentation: Secondary | ICD-10-CM | POA: Diagnosis not present

## 2022-04-20 DIAGNOSIS — D1801 Hemangioma of skin and subcutaneous tissue: Secondary | ICD-10-CM | POA: Diagnosis not present

## 2022-06-22 DIAGNOSIS — D485 Neoplasm of uncertain behavior of skin: Secondary | ICD-10-CM | POA: Diagnosis not present

## 2022-06-22 DIAGNOSIS — C44729 Squamous cell carcinoma of skin of left lower limb, including hip: Secondary | ICD-10-CM | POA: Diagnosis not present

## 2022-09-12 DIAGNOSIS — Z139 Encounter for screening, unspecified: Secondary | ICD-10-CM | POA: Diagnosis not present

## 2022-09-12 DIAGNOSIS — E782 Mixed hyperlipidemia: Secondary | ICD-10-CM | POA: Diagnosis not present

## 2022-09-14 DIAGNOSIS — H5203 Hypermetropia, bilateral: Secondary | ICD-10-CM | POA: Diagnosis not present

## 2022-09-17 DIAGNOSIS — U071 COVID-19: Secondary | ICD-10-CM | POA: Diagnosis not present

## 2022-09-27 DIAGNOSIS — Z0001 Encounter for general adult medical examination with abnormal findings: Secondary | ICD-10-CM | POA: Diagnosis not present

## 2022-09-27 DIAGNOSIS — R7303 Prediabetes: Secondary | ICD-10-CM | POA: Diagnosis not present

## 2022-09-27 DIAGNOSIS — C44729 Squamous cell carcinoma of skin of left lower limb, including hip: Secondary | ICD-10-CM | POA: Diagnosis not present

## 2022-09-27 DIAGNOSIS — E782 Mixed hyperlipidemia: Secondary | ICD-10-CM | POA: Diagnosis not present

## 2022-10-24 DIAGNOSIS — L578 Other skin changes due to chronic exposure to nonionizing radiation: Secondary | ICD-10-CM | POA: Diagnosis not present

## 2022-10-24 DIAGNOSIS — C44722 Squamous cell carcinoma of skin of right lower limb, including hip: Secondary | ICD-10-CM | POA: Diagnosis not present

## 2022-10-24 DIAGNOSIS — D485 Neoplasm of uncertain behavior of skin: Secondary | ICD-10-CM | POA: Diagnosis not present

## 2022-10-24 DIAGNOSIS — D1801 Hemangioma of skin and subcutaneous tissue: Secondary | ICD-10-CM | POA: Diagnosis not present

## 2022-10-24 DIAGNOSIS — C44729 Squamous cell carcinoma of skin of left lower limb, including hip: Secondary | ICD-10-CM | POA: Diagnosis not present

## 2022-10-24 DIAGNOSIS — L821 Other seborrheic keratosis: Secondary | ICD-10-CM | POA: Diagnosis not present

## 2022-10-24 DIAGNOSIS — L814 Other melanin hyperpigmentation: Secondary | ICD-10-CM | POA: Diagnosis not present

## 2022-10-24 DIAGNOSIS — C44519 Basal cell carcinoma of skin of other part of trunk: Secondary | ICD-10-CM | POA: Diagnosis not present

## 2022-12-12 DIAGNOSIS — K080 Exfoliation of teeth due to systemic causes: Secondary | ICD-10-CM | POA: Diagnosis not present

## 2022-12-15 ENCOUNTER — Ambulatory Visit: Payer: Medicare Other | Admitting: Podiatry

## 2022-12-15 ENCOUNTER — Ambulatory Visit (INDEPENDENT_AMBULATORY_CARE_PROVIDER_SITE_OTHER): Payer: Medicare Other

## 2022-12-15 DIAGNOSIS — M7660 Achilles tendinitis, unspecified leg: Secondary | ICD-10-CM

## 2022-12-15 DIAGNOSIS — M7731 Calcaneal spur, right foot: Secondary | ICD-10-CM | POA: Diagnosis not present

## 2022-12-15 DIAGNOSIS — M778 Other enthesopathies, not elsewhere classified: Secondary | ICD-10-CM

## 2022-12-15 MED ORDER — MELOXICAM 15 MG PO TABS: 15.0000 mg | ORAL_TABLET | Freq: Every day | ORAL | 0 refills | Status: DC

## 2022-12-15 MED ORDER — METHYLPREDNISOLONE 4 MG PO TBPK: ORAL_TABLET | ORAL | 0 refills | Status: DC

## 2022-12-15 NOTE — Progress Notes (Signed)
Subjective:  Patient ID: Cindy Noble, female    DOB: [birth year],  MRN: [id number]  Chief Complaint  Patient presents with   Foot Pain    Foot pain to right heel and medial aspect of right ankle. Onset x9 months. Does not take any pain medications.     67 y.o. female presents with concern for pain to the back of the right heel and medial aspect of the ankle.  She says this is been present for at least 9 months.  She does have a prominence noted to the back of the right heel that gets very tender at times especially worse with activity.  She denies any acute injury.  She is not taking any pain medication at this time.  Past Medical History:  Diagnosis Date   Abnormal Pap smear of cervix    many yrs ago   Insomnia     No Known Allergies  ROS: Negative except as per HPI above  Objective:  General: AAO x3, NAD  Dermatological: With inspection and palpation of the right and left lower extremities there are no open sores, no preulcerative lesions, no rash or signs of infection present. Nails are of normal length thickness and coloration.   Vascular:  Dorsalis Pedis artery and Posterior Tibial artery pedal pulses are 2/4 bilateral.  Capillary fill time < 3 sec to all digits.   Neruologic: Grossly intact via light touch bilateral. Protective threshold intact to all sites bilateral.   Musculoskeletal: Osseous prominence noted at the posterior aspect of the right calcaneus near the insertion of the Achilles tendon.  There is pain with palpation at the insertion of the Achilles as well and slightly proximal to that area.  Slightly decreased ankle dorsiflexion range of motion but does not elicit a significant amount of pain.  Gait: Unassisted, Nonantalgic.   No images are attached to the encounter.  Radiographs:  Date: 12/15/2022 XR the right foot Weightbearing AP/Lateral/Oblique   Findings: Attention directed to the posterior aspect of the right heel there is noted to be osseous spurring  at the insertion point of the Achilles tendon consistent with retrocalcaneal exostosis.  There is also mild Haglund's deformity present.  No significant intraosseous calcification of the Achilles is noted. Assessment:   1. Bone spur of posterior portion of right calcaneus   2. Insertional Achilles tendinopathy   3. Capsulitis of foot, right      Plan:  Patient was evaluated and treated and all questions answered.  # Retrocalcaneal exostosis and insertional Achilles tendinopathy -Discussed with the patient that I do believe she is suffering from a bone spur at the back of her right heel which is causing inflammation in the Achilles -I recommend icing anti-inflammatory therapy and stretching exercises.  Provided Achilles rehab exercises -Recommend treatment with methylprednisolone steroid Dosepak take as directed 4 mg take as directed for 6 days for pain inflammation.  Discussed the risk and side effects of taking oral corticosteroids. -E Rx for meloxicam 15 mg take once daily for next 30 days for pain and inflammation of the right heel -Activity modification was recommended -Heel lifts also dispensed to the patient and recommend she use at least 1 if not 2 heel lifts in her shoes to offload the insertion point of the Achilles at the back of the right heel decrease inflammation.  Return in about 6 weeks (around 01/26/2023) for Follow-up right Achilles tendon pain.          Everitt Amber, DPM [facility] / [facility]

## 2022-12-15 NOTE — Patient Instructions (Signed)
Achilles Tendinitis  with Rehab Achilles tendinitis is a disorder of the Achilles tendon. The Achilles tendon connects the large calf muscles (Gastrocnemius and Soleus) to the heel bone (calcaneus). This tendon is sometimes called the heel cord. It is important for pushing-off and standing on your toes and is important for walking, running, or jumping. Tendinitis is often caused by overuse and repetitive microtrauma. SYMPTOMS  Pain, tenderness, swelling, warmth, and redness may occur over the Achilles tendon even at rest.  Pain with pushing off, or flexing or extending the ankle.  Pain that is worsened after or during activity. CAUSES   Overuse sometimes seen with rapid increase in exercise programs or in sports requiring running and jumping.  Poor physical conditioning (strength and flexibility or endurance).  Running sports, especially training running down hills.  Inadequate warm-up before practice or play or failure to stretch before participation.  Injury to the tendon. PREVENTION   Warm up and stretch before practice or competition.  Allow time for adequate rest and recovery between practices and competition.  Keep up conditioning.  Keep up ankle and leg flexibility.  Improve or keep muscle strength and endurance.  Improve cardiovascular fitness.  Use proper technique.  Use proper equipment (shoes, skates).  To help prevent recurrence, taping, protective strapping, or an adhesive bandage may be recommended for several weeks after healing is complete. PROGNOSIS   Recovery may take weeks to several months to heal.  Longer recovery is expected if symptoms have been prolonged.  Recovery is usually quicker if the inflammation is due to a direct blow as compared with overuse or sudden strain. RELATED COMPLICATIONS   Healing time will be prolonged if the condition is not correctly treated. The injury must be given plenty of time to heal.  Symptoms can reoccur if  activity is resumed too soon.  Untreated, tendinitis may increase the risk of tendon rupture requiring additional time for recovery and possibly surgery. TREATMENT   The first treatment consists of rest anti-inflammatory medication, and ice to relieve the pain.  Stretching and strengthening exercises after resolution of pain will likely help reduce the risk of recurrence. Referral to a physical therapist or athletic trainer for further evaluation and treatment may be helpful.  A walking boot or cast may be recommended to rest the Achilles tendon. This can help break the cycle of inflammation and microtrauma.  Arch supports (orthotics) may be prescribed or recommended by your caregiver as an adjunct to therapy and rest.  Surgery to remove the inflamed tendon lining or degenerated tendon tissue is rarely necessary and has shown less than predictable results. MEDICATION   Nonsteroidal anti-inflammatory medications, such as aspirin and ibuprofen, may be used for pain and inflammation relief. Do not take within 7 days before surgery. Take these as directed by your caregiver. Contact your caregiver immediately if any bleeding, stomach upset, or signs of allergic reaction occur. Other minor pain relievers, such as acetaminophen, may also be used.  Pain relievers may be prescribed as necessary by your caregiver. Do not take prescription pain medication for longer than 4 to 7 days. Use only as directed and only as much as you need. HEAT AND COLD  Cold is used to relieve pain and reduce inflammation for acute and chronic Achilles tendinitis. Cold should be applied for 10 to 15 minutes every 2 to 3 hours for inflammation and pain and immediately after any activity that aggravates your symptoms. Use ice packs or an ice massage.  Heat may be used   before performing stretching and strengthening activities prescribed by your caregiver. Use a heat pack or a warm soak. SEEK MEDICAL CARE IF:  Symptoms get  worse or do not improve in 2 weeks despite treatment.  New, unexplained symptoms develop. Drugs used in treatment may produce side effects.   EXERCISES-- hold each stretch for 30 seconds and repeat 10 times.  Complete each stretch 3 times per day.   RANGE OF MOTION (ROM) AND STRETCHING EXERCISES - Achilles Tendinitis  These exercises may help you when beginning to rehabilitate your injury. Your symptoms may resolve with or without further involvement from your physician, physical therapist or athletic trainer. While completing these exercises, remember:   Restoring tissue flexibility helps normal motion to return to the joints. This allows healthier, less painful movement and activity.  An effective stretch should be held for at least 30 seconds.  A stretch should never be painful. You should only feel a gentle lengthening or release in the stretched tissue. STRETCH  Gastroc, Standing   Place hands on wall.  Extend right / left leg, keeping the front knee somewhat bent.  Slightly point your toes inward on your back foot.  Keeping your right / left heel on the floor and your knee straight, shift your weight toward the wall, not allowing your back to arch.  You should feel a gentle stretch in the right / left calf. Hold this position for __________ seconds. Repeat __________ times. Complete this stretch __________ times per day. STRETCH  Soleus, Standing   Place hands on wall.  Extend right / left leg, keeping the other knee somewhat bent.  Slightly point your toes inward on your back foot.  Keep your right / left heel on the floor, bend your back knee, and slightly shift your weight over the back leg so that you feel a gentle stretch deep in your back calf.  Hold this position for __________ seconds. Repeat __________ times. Complete this stretch __________ times per day. STRETCH  Gastrocsoleus, Standing  Note: This exercise can place a lot of stress on your foot and ankle.  Please complete this exercise only if specifically instructed by your caregiver.   Place the ball of your right / left foot on a step, keeping your other foot firmly on the same step.  Hold on to the wall or a rail for balance.  Slowly lift your other foot, allowing your body weight to press your heel down over the edge of the step.  You should feel a stretch in your right / left calf.  Hold this position for __________ seconds.  Repeat this exercise with a slight bend in your knee. Repeat __________ times. Complete this stretch __________ times per day.  STRENGTHENING EXERCISES - Achilles Tendinitis These exercises may help you when beginning to rehabilitate your injury. They may resolve your symptoms with or without further involvement from your physician, physical therapist or athletic trainer. While completing these exercises, remember:   Muscles can gain both the endurance and the strength needed for everyday activities through controlled exercises.  Complete these exercises as instructed by your physician, physical therapist or athletic trainer. Progress the resistance and repetitions only as guided.  You may experience muscle soreness or fatigue, but the pain or discomfort you are trying to eliminate should never worsen during these exercises. If this pain does worsen, stop and make certain you are following the directions exactly. If the pain is still present after adjustments, discontinue the exercise until you can discuss   the trouble with your clinician. STRENGTH - Plantar-flexors   Sit with your right / left leg extended. Holding onto both ends of a rubber exercise band/tubing, loop it around the ball of your foot. Keep a slight tension in the band.  Slowly push your toes away from you, pointing them downward.  Hold this position for __________ seconds. Return slowly, controlling the tension in the band/tubing. Repeat __________ times. Complete this exercise __________ times  per day.  STRENGTH - Plantar-flexors   Stand with your feet shoulder width apart. Steady yourself with a wall or table using as little support as needed.  Keeping your weight evenly spread over the width of your feet, rise up on your toes.*  Hold this position for __________ seconds. Repeat __________ times. Complete this exercise __________ times per day.  *If this is too easy, shift your weight toward your right / left leg until you feel challenged. Ultimately, you may be asked to do this exercise with your right / left foot only. STRENGTH  Plantar-flexors, Eccentric  Note: This exercise can place a lot of stress on your foot and ankle. Please complete this exercise only if specifically instructed by your caregiver.   Place the balls of your feet on a step. With your hands, use only enough support from a wall or rail to keep your balance.  Keep your knees straight and rise up on your toes.  Slowly shift your weight entirely to your right / left toes and pick up your opposite foot. Gently and with controlled movement, lower your weight through your right / left foot so that your heel drops below the level of the step. You will feel a slight stretch in the back of your calf at the end position.  Use the healthy leg to help rise up onto the balls of both feet, then lower weight only on the right / left leg again. Build up to 15 repetitions. Then progress to 3 consecutive sets of 15 repetitions.*  After completing the above exercise, complete the same exercise with a slight knee bend (about 30 degrees). Again, build up to 15 repetitions. Then progress to 3 consecutive sets of 15 repetitions.* Perform this exercise __________ times per day.  *When you easily complete 3 sets of 15, your physician, physical therapist or athletic trainer may advise you to add resistance by wearing a backpack filled with additional weight. STRENGTH - Plantar Flexors, Seated   Sit on a chair that allows your feet  to rest flat on the ground. If necessary, sit at the edge of the chair.  Keeping your toes firmly on the ground, lift your right / left heel as far as you can without increasing any discomfort in your ankle. Repeat __________ times. Complete this exercise __________ times a day. *If instructed by your physician, physical therapist or athletic trainer, you may add ____________________ of resistance by placing a weighted object on your right / left knee. Document Released: 04/27/2005 Document Revised: 12/19/2011 Document Reviewed: 01/08/2009 [employer] Patient Information 2014 [employer].    

## 2022-12-29 DIAGNOSIS — Z1231 Encounter for screening mammogram for malignant neoplasm of breast: Secondary | ICD-10-CM | POA: Diagnosis not present

## 2023-01-19 ENCOUNTER — Ambulatory Visit: Payer: Medicare Other | Admitting: Podiatry

## 2023-01-19 DIAGNOSIS — M7660 Achilles tendinitis, unspecified leg: Secondary | ICD-10-CM | POA: Diagnosis not present

## 2023-01-19 DIAGNOSIS — M7731 Calcaneal spur, right foot: Secondary | ICD-10-CM

## 2023-01-19 NOTE — Progress Notes (Signed)
  Subjective:  Patient ID: Cindy Noble, female    DOB: [birth year],  MRN: [id number]  Chief Complaint  Patient presents with   Follow-up    Patient here for follow up on right achillies tendonitis patient stated she's doing well, taking her medications as prescribed     67 y.o. female presents for follow-up of right retrocalcaneal exostosis and Achilles tendinitis.  Patient reports she is feeling better with decreased pain in the right heel.  She has been stretching.  She is also been taking meloxicam and took a course of methylprednisolone after last visit which both of which she thinks are helping.  Past Medical History:  Diagnosis Date   Abnormal Pap smear of cervix    many yrs ago   Insomnia     No Known Allergies  ROS: Negative except as per HPI above  Objective:  General: AAO x3, NAD  Dermatological: With inspection and palpation of the right and left lower extremities there are no open sores, no preulcerative lesions, no rash or signs of infection present. Nails are of normal length thickness and coloration.   Vascular:  Dorsalis Pedis artery and Posterior Tibial artery pedal pulses are 2/4 bilateral.  Capillary fill time < 3 sec to all digits.   Neruologic: Grossly intact via light touch bilateral. Protective threshold intact to all sites bilateral.   Musculoskeletal: Significantly decreased pain and edema at the posterior aspect of the right heel.  Much improved from prior.  Gait: Unassisted, Nonantalgic.   No images are attached to the encounter.  Radiographs:  Date: 12/15/2022 XR the right foot Weightbearing AP/Lateral/Oblique   Findings: Attention directed to the posterior aspect of the right heel there is noted to be osseous spurring at the insertion point of the Achilles tendon consistent with retrocalcaneal exostosis.  There is also mild Haglund's deformity present.  No significant intraosseous calcification of the Achilles is noted. Assessment:   1. Bone spur  of posterior portion of right calcaneus   2. Insertional Achilles tendinopathy       Plan:  Patient was evaluated and treated and all questions answered.  # Retrocalcaneal exostosis and insertional Achilles tendinopathy -Overall patient is much improved with conservative measures including stretching icing anti-inflammatory therapy with meloxicam and steroid Dosepak -I recommend at this point we continue with conservative therapy including continued stretching exercises as well as ibuprofen as needed as well as icing as needed -Defer further medication management at this time -Continue with heel lifts -If patient has another flareup in pain we will have to consider formal physical therapy or discuss further surgical options for treatment of this problem  No follow-ups on file.          Corinna Gab, DPM [facility] / [facility]

## 2023-04-24 DIAGNOSIS — L578 Other skin changes due to chronic exposure to nonionizing radiation: Secondary | ICD-10-CM | POA: Diagnosis not present

## 2023-04-24 DIAGNOSIS — L570 Actinic keratosis: Secondary | ICD-10-CM | POA: Diagnosis not present

## 2023-04-24 DIAGNOSIS — D485 Neoplasm of uncertain behavior of skin: Secondary | ICD-10-CM | POA: Diagnosis not present

## 2023-04-24 DIAGNOSIS — D0471 Carcinoma in situ of skin of right lower limb, including hip: Secondary | ICD-10-CM | POA: Diagnosis not present

## 2023-04-24 DIAGNOSIS — L821 Other seborrheic keratosis: Secondary | ICD-10-CM | POA: Diagnosis not present

## 2023-04-24 DIAGNOSIS — D1801 Hemangioma of skin and subcutaneous tissue: Secondary | ICD-10-CM | POA: Diagnosis not present

## 2023-04-24 DIAGNOSIS — C44722 Squamous cell carcinoma of skin of right lower limb, including hip: Secondary | ICD-10-CM | POA: Diagnosis not present

## 2023-04-24 DIAGNOSIS — L814 Other melanin hyperpigmentation: Secondary | ICD-10-CM | POA: Diagnosis not present

## 2023-04-24 DIAGNOSIS — L820 Inflamed seborrheic keratosis: Secondary | ICD-10-CM | POA: Diagnosis not present

## 2023-05-31 DIAGNOSIS — L570 Actinic keratosis: Secondary | ICD-10-CM | POA: Diagnosis not present

## 2023-05-31 DIAGNOSIS — L820 Inflamed seborrheic keratosis: Secondary | ICD-10-CM | POA: Diagnosis not present

## 2023-06-15 DIAGNOSIS — K080 Exfoliation of teeth due to systemic causes: Secondary | ICD-10-CM | POA: Diagnosis not present

## 2023-09-18 DIAGNOSIS — H5203 Hypermetropia, bilateral: Secondary | ICD-10-CM | POA: Diagnosis not present

## 2023-09-25 DIAGNOSIS — I10 Essential (primary) hypertension: Secondary | ICD-10-CM | POA: Diagnosis not present

## 2023-09-29 DIAGNOSIS — Z6825 Body mass index (BMI) 25.0-25.9, adult: Secondary | ICD-10-CM | POA: Diagnosis not present

## 2023-09-29 DIAGNOSIS — E663 Overweight: Secondary | ICD-10-CM | POA: Diagnosis not present

## 2023-09-29 DIAGNOSIS — Z0000 Encounter for general adult medical examination without abnormal findings: Secondary | ICD-10-CM | POA: Diagnosis not present

## 2023-09-29 DIAGNOSIS — Z0001 Encounter for general adult medical examination with abnormal findings: Secondary | ICD-10-CM | POA: Diagnosis not present

## 2023-09-29 DIAGNOSIS — R7303 Prediabetes: Secondary | ICD-10-CM | POA: Diagnosis not present

## 2023-09-29 DIAGNOSIS — E782 Mixed hyperlipidemia: Secondary | ICD-10-CM | POA: Diagnosis not present

## 2023-12-26 DIAGNOSIS — L820 Inflamed seborrheic keratosis: Secondary | ICD-10-CM | POA: Diagnosis not present

## 2023-12-26 DIAGNOSIS — L814 Other melanin hyperpigmentation: Secondary | ICD-10-CM | POA: Diagnosis not present

## 2023-12-26 DIAGNOSIS — L570 Actinic keratosis: Secondary | ICD-10-CM | POA: Diagnosis not present

## 2024-01-02 DIAGNOSIS — Z1231 Encounter for screening mammogram for malignant neoplasm of breast: Secondary | ICD-10-CM | POA: Diagnosis not present

## 2024-02-06 DIAGNOSIS — N6341 Unspecified lump in right breast, subareolar: Secondary | ICD-10-CM | POA: Diagnosis not present

## 2024-02-19 DIAGNOSIS — N6341 Unspecified lump in right breast, subareolar: Secondary | ICD-10-CM | POA: Diagnosis not present

## 2024-02-26 DIAGNOSIS — N6091 Unspecified benign mammary dysplasia of right breast: Secondary | ICD-10-CM | POA: Diagnosis not present

## 2024-02-26 DIAGNOSIS — N6341 Unspecified lump in right breast, subareolar: Secondary | ICD-10-CM | POA: Diagnosis not present

## 2024-02-26 DIAGNOSIS — N62 Hypertrophy of breast: Secondary | ICD-10-CM | POA: Diagnosis not present

## 2024-03-05 ENCOUNTER — Other Ambulatory Visit (HOSPITAL_COMMUNITY): Payer: Self-pay | Admitting: Nurse Practitioner

## 2024-03-05 DIAGNOSIS — N6341 Unspecified lump in right breast, subareolar: Secondary | ICD-10-CM

## 2024-03-08 ENCOUNTER — Ambulatory Visit (HOSPITAL_COMMUNITY)
Admission: RE | Admit: 2024-03-08 | Discharge: 2024-03-08 | Disposition: A | Discharge status: Home / Self Care | Source: Ambulatory Visit | Attending: Nurse Practitioner | Admitting: Nurse Practitioner

## 2024-03-08 DIAGNOSIS — R928 Other abnormal and inconclusive findings on diagnostic imaging of breast: Secondary | ICD-10-CM | POA: Diagnosis not present

## 2024-03-08 DIAGNOSIS — N6341 Unspecified lump in right breast, subareolar: Secondary | ICD-10-CM | POA: Insufficient documentation

## 2024-03-08 DIAGNOSIS — N6091 Unspecified benign mammary dysplasia of right breast: Secondary | ICD-10-CM | POA: Diagnosis not present

## 2024-03-08 DIAGNOSIS — R9230 Dense breasts, unspecified: Secondary | ICD-10-CM | POA: Diagnosis not present

## 2024-03-08 DIAGNOSIS — N63 Unspecified lump in unspecified breast: Secondary | ICD-10-CM | POA: Diagnosis not present

## 2024-03-08 MED ORDER — GADOBUTROL 1 MMOL/ML IV SOLN
6.0000 mL | Freq: Once | INTRAVENOUS | Status: AC | PRN
  Administered 2024-03-08: 6 mL via INTRAVENOUS

## 2024-04-08 ENCOUNTER — Other Ambulatory Visit: Payer: Self-pay | Admitting: *Deleted

## 2024-04-08 DIAGNOSIS — N63 Unspecified lump in unspecified breast: Secondary | ICD-10-CM

## 2024-04-09 ENCOUNTER — Encounter: Payer: Self-pay | Admitting: Surgery

## 2024-04-09 ENCOUNTER — Ambulatory Visit: Admitting: Surgery

## 2024-04-09 VITALS — BP 153/88 | HR 67 | Temp 97.7°F | Resp 14 | Ht 61.0 in | Wt 148.0 lb

## 2024-04-09 DIAGNOSIS — N6341 Unspecified lump in right breast, subareolar: Secondary | ICD-10-CM

## 2024-04-09 NOTE — Progress Notes (Signed)
 [facility] History and Physical  Reason for Referral: Right breast subareolar mass Referring Physician: Dr. Shona  Chief Complaint   New Patient (Initial Visit)     Cindy Noble is a 68 y.o. female.  HPI: Patient presents for evaluation of the subareolar mass.  She had a mammogram on March 25 which was noted to be normal.  Starting 1 week later, she noticed itching around her nipple and a pea-sized area just below the nipple.  She initially thought this was a bug bite, however it did not change after a week, so she presented to her primary care doctor.  At which time they proceeded with ultrasound, biopsy, and MRI.  She states that the area feels elongated now and different than when she first noticed it.  The patient has no other history of any masses, lumps, bumps, nipple changes or discharge. She had menarche at age 26.  She is G1P1. She did not breastfeed her children.  She has a family history of breast cancer in 2 maternal aunts. She underwent menopause at 74 after she underwent a partial hysterectomy.  She has a history of a previous left breast biopsy that was performed many years ago via MRI, but she does not remember the results of this study.  She believes it was benign.  She has not had any chest radiation.  She has no significant past medical history.  She denies use of blood thinning medications.  Her surgical history is significant for C-section and a partial hysterectomy.  She occasionally will drink alcohol.  She denies use of tobacco products and illicit drugs.  Past Medical History:  Diagnosis Date   Abnormal Pap smear of cervix    many yrs ago   Insomnia     Past Surgical History:  Procedure Laterality Date   BREAST BIOPSY Left 03/31/2010    core biopsy:  fibrocystic changes with adenosis and stromal fibrosis   CESAREAN SECTION  01/13/1972   COLPOSCOPY  mid 80's   dysplasia, pap every 6 months for years per patient   GYNECOLOGIC CRYOSURGERY  mid 80's    LAPAROSCOPIC ASSISTED VAGINAL HYSTERECTOMY  10/13/2008   ovaries remain,  secondary to hyperplasia with atypia, endo polyp    Family History  Problem Relation Age of Onset   Aneurysm Mother    Diabetes Father    Hyperlipidemia Father    Heart attack Father    Heart disease Father    Ovarian cancer Maternal Aunt    Diabetes Paternal Aunt    Aneurysm Maternal Grandfather    Diabetes Paternal Grandmother    Heart attack Paternal Grandmother    Breast cancer Maternal Aunt    Breast cancer Maternal Aunt    Aneurysm Maternal Aunt    Diabetes Sister    Hyperlipidemia Sister     Social History   Tobacco Use   Smoking status: Never   Smokeless tobacco: Never  Substance Use Topics   Alcohol use: Yes    Alcohol/week: 0.0 - 1.0 standard drinks of alcohol   Drug use: No    Medications: I have reviewed the patient's current medications. Allergies as of 04/09/2024   No Known Allergies      Medication List        Accurate as of April 09, 2024 10:51 AM. If you have any questions, ask your nurse or doctor.          STOP taking these medications    meloxicam  15 MG tablet Commonly known  as: MOBIC  Stopped by: Kelley Polinsky A Kaeson Kleinert   methylPREDNISolone  4 MG Tbpk tablet Commonly known as: MEDROL  DOSEPAK Stopped by: Chasady Longwell A Leonetta Mcgivern         ROS:  Constitutional: negative for chills, fatigue, and fevers Eyes: negative for visual disturbance and pain Ears, nose, mouth, throat, and face: negative for ear drainage, sore throat, and sinus problems Respiratory: negative for cough, wheezing, and shortness of breath Cardiovascular: negative for chest pain and palpitations Gastrointestinal: negative for abdominal pain, nausea, reflux symptoms, and vomiting Genitourinary:negative for dysuria and frequency Integument/breast: negative for dryness and rash Hematologic/lymphatic: negative for bleeding and lymphadenopathy Musculoskeletal:negative for back pain and neck  pain Neurological: negative for dizziness and tremors Endocrine: negative for temperature intolerance  Blood pressure (!) 153/88, pulse 67, temperature 97.7 F (36.5 C), temperature source Oral, resp. rate 14, height 5' 1 (1.549 m), weight 148 lb (67.1 kg), last menstrual period 10/10/2006, SpO2 96%. Physical Exam Vitals reviewed.  Constitutional:      Appearance: Normal appearance.  HENT:     Head: Normocephalic and atraumatic.   Eyes:     Extraocular Movements: Extraocular movements intact.     Pupils: Pupils are equal, round, and reactive to light.    Cardiovascular:     Rate and Rhythm: Normal rate and regular rhythm.  Pulmonary:     Effort: Pulmonary effort is normal.     Breath sounds: Normal breath sounds.  Chest:  Breasts:    Right: Mass present. No bleeding, inverted nipple, nipple discharge, skin change or tenderness.     Left: No bleeding, inverted nipple, mass, nipple discharge, skin change or tenderness.     Comments: Right breast with a palpable mass just below the nipple at the 7 o'clock position, no overlying skin changes Abdominal:     General: There is no distension.     Palpations: Abdomen is soft.     Tenderness: There is no abdominal tenderness.   Musculoskeletal:        General: Normal range of motion.     Cervical back: Normal range of motion.  Lymphadenopathy:     Upper Body:     Right upper body: No supraclavicular or axillary adenopathy.     Left upper body: No supraclavicular or axillary adenopathy.   Skin:    General: Skin is warm and dry.   Neurological:     General: No focal deficit present.     Mental Status: She is alert and oriented to person, place, and time.   Psychiatric:        Mood and Affect: Mood normal.        Behavior: Behavior normal.     Results: Bilateral breast MRI (03/08/2024): IMPRESSION: 1. Indeterminate 1.1 cm non-mass enhancement involving the RIGHT nipple extending into the subareolar location. The ribbon  shaped tissue marking clip placed at the time of the recent biopsy is at the inferolateral margin of the enhancement; however, the enhancement in this location would be discordant with pathology demonstrating benign ducts and focal UDH. 2. No MRI evidence of malignancy involving the LEFT breast.   RECOMMENDATION: Consider surgical consultation to discuss central duct excision and nipple exploration. The enhancement of the nipple and directly behind nipple is not amenable to MRI guided biopsy.   BI-RADS CATEGORY  4: Suspicious.  Right breast ultrasound biopsy (02/26/2024): Impression: 1.  Ultrasound-guided core biopsy of the palpable mass in the immediate retroareolar right breast/right nipple. 2.  Ribbon shaped biopsy marking clip at site of biopsy  of mass in the subareolar right breast/right nipple.  Addendum: Radiology and pathology concordance review.  Pathology:  Pathology results from the palpable mass in the right nipple/subareolar right breast reveal benign subareolar ducts with focal usual ductal hyperplasia.  Negative for atypia or malignancy.  Category: Benign  Rad-Path concordance: Concordant  Recommendation: Breast MRI with contrast is recommended given the new palpable area of concern involving the right nipple with questionable small mass seen in the nipple sonographically and this area was difficult to biopsy given the extremely superficial location.  The above radiologic-pathologic concordance and recommendations were reviewed by the attending radiologist prior to notifying the referring provider and patient.  The findings and recommendations were discussed with the patient at 8:55 AM on 02/28/2024.  The patient demonstrates understanding and agrees with the plan.  She is doing well postbiopsy with mild right breast soreness.  Right breast ultrasound (02/19/2024): Impression: 0.5 cm possible intraductal mass involving the right nipple/retroareolar right  breast.  Assessment: BI-RADS category: 4A-biopsy: Suspicious.  Low suspicion for malignancy.  Biopsy should be performed in the absence of clinical contraindication.  Recommendation laterality: Right  Ultrasound-guided core needle biopsy and clip placement is recommended.  The findings and recommendation were discussed in full with the patient.  Assessment & Plan:  Cindy Noble is a 68 y.o. female who presents for evaluation of a right breast subareolar mass.  -We discussed that her biopsy results and the MRI findings are discordant.  For this reason, it has been recommended that she proceeds with surgical excision of this nipple mass. -The risk and benefits of right breast lumpectomy were discussed including but not limited to bleeding, infection, injury to surrounding structures, need for additional procedures, ischemia or necrosis to the full.  After careful consideration, Cindy Noble has decided to proceed with surgery. -Patient tentatively scheduled for surgery on 7/17 -We discussed the potential need for further surgeries pending her pathology results.  Patient is agreeable and understanding -Information provided to the patient regarding excisional breast biopsies  All questions were answered to the satisfaction of the patient.  Note: Portions of this report may have been transcribed using voice recognition software. Every effort has been made to ensure accuracy; however, inadvertent computerized transcription errors may still be present.   Dorothyann Brittle, DO [facility] [address] [place], [place] [address] [phone] (office)

## 2024-04-12 NOTE — H&P (Signed)
 [facility] History and Physical  Reason for Referral: Right breast subareolar mass Referring Physician: Dr. Shona  Chief Complaint   New Patient (Initial Visit)     Cindy Noble is a 68 y.o. female.  HPI: Patient presents for evaluation of the subareolar mass.  She had a mammogram on March 25 which was noted to be normal.  Starting 1 week later, she noticed itching around her nipple and a pea-sized area just below the nipple.  She initially thought this was a bug bite, however it did not change after a week, so she presented to her primary care doctor.  At which time they proceeded with ultrasound, biopsy, and MRI.  She states that the area feels elongated now and different than when she first noticed it.  The patient has no other history of any masses, lumps, bumps, nipple changes or discharge. She had menarche at age 26.  She is G1P1. She did not breastfeed her children.  She has a family history of breast cancer in 2 maternal aunts. She underwent menopause at 74 after she underwent a partial hysterectomy.  She has a history of a previous left breast biopsy that was performed many years ago via MRI, but she does not remember the results of this study.  She believes it was benign.  She has not had any chest radiation.  She has no significant past medical history.  She denies use of blood thinning medications.  Her surgical history is significant for C-section and a partial hysterectomy.  She occasionally will drink alcohol.  She denies use of tobacco products and illicit drugs.  Past Medical History:  Diagnosis Date   Abnormal Pap smear of cervix    many yrs ago   Insomnia     Past Surgical History:  Procedure Laterality Date   BREAST BIOPSY Left 03/31/2010    core biopsy:  fibrocystic changes with adenosis and stromal fibrosis   CESAREAN SECTION  01/13/1972   COLPOSCOPY  mid 80's   dysplasia, pap every 6 months for years per patient   GYNECOLOGIC CRYOSURGERY  mid 80's    LAPAROSCOPIC ASSISTED VAGINAL HYSTERECTOMY  10/13/2008   ovaries remain,  secondary to hyperplasia with atypia, endo polyp    Family History  Problem Relation Age of Onset   Aneurysm Mother    Diabetes Father    Hyperlipidemia Father    Heart attack Father    Heart disease Father    Ovarian cancer Maternal Aunt    Diabetes Paternal Aunt    Aneurysm Maternal Grandfather    Diabetes Paternal Grandmother    Heart attack Paternal Grandmother    Breast cancer Maternal Aunt    Breast cancer Maternal Aunt    Aneurysm Maternal Aunt    Diabetes Sister    Hyperlipidemia Sister     Social History   Tobacco Use   Smoking status: Never   Smokeless tobacco: Never  Substance Use Topics   Alcohol use: Yes    Alcohol/week: 0.0 - 1.0 standard drinks of alcohol   Drug use: No    Medications: I have reviewed the patient's current medications. Allergies as of 04/09/2024   No Known Allergies      Medication List        Accurate as of April 09, 2024 10:51 AM. If you have any questions, ask your nurse or doctor.          STOP taking these medications    meloxicam  15 MG tablet Commonly known  as: MOBIC  Stopped by: Kelley Polinsky A Kaeson Kleinert   methylPREDNISolone  4 MG Tbpk tablet Commonly known as: MEDROL  DOSEPAK Stopped by: Chasady Longwell A Leonetta Mcgivern         ROS:  Constitutional: negative for chills, fatigue, and fevers Eyes: negative for visual disturbance and pain Ears, nose, mouth, throat, and face: negative for ear drainage, sore throat, and sinus problems Respiratory: negative for cough, wheezing, and shortness of breath Cardiovascular: negative for chest pain and palpitations Gastrointestinal: negative for abdominal pain, nausea, reflux symptoms, and vomiting Genitourinary:negative for dysuria and frequency Integument/breast: negative for dryness and rash Hematologic/lymphatic: negative for bleeding and lymphadenopathy Musculoskeletal:negative for back pain and neck  pain Neurological: negative for dizziness and tremors Endocrine: negative for temperature intolerance  Blood pressure (!) 153/88, pulse 67, temperature 97.7 F (36.5 C), temperature source Oral, resp. rate 14, height 5' 1 (1.549 m), weight 148 lb (67.1 kg), last menstrual period 10/10/2006, SpO2 96%. Physical Exam Vitals reviewed.  Constitutional:      Appearance: Normal appearance.  HENT:     Head: Normocephalic and atraumatic.   Eyes:     Extraocular Movements: Extraocular movements intact.     Pupils: Pupils are equal, round, and reactive to light.    Cardiovascular:     Rate and Rhythm: Normal rate and regular rhythm.  Pulmonary:     Effort: Pulmonary effort is normal.     Breath sounds: Normal breath sounds.  Chest:  Breasts:    Right: Mass present. No bleeding, inverted nipple, nipple discharge, skin change or tenderness.     Left: No bleeding, inverted nipple, mass, nipple discharge, skin change or tenderness.     Comments: Right breast with a palpable mass just below the nipple at the 7 o'clock position, no overlying skin changes Abdominal:     General: There is no distension.     Palpations: Abdomen is soft.     Tenderness: There is no abdominal tenderness.   Musculoskeletal:        General: Normal range of motion.     Cervical back: Normal range of motion.  Lymphadenopathy:     Upper Body:     Right upper body: No supraclavicular or axillary adenopathy.     Left upper body: No supraclavicular or axillary adenopathy.   Skin:    General: Skin is warm and dry.   Neurological:     General: No focal deficit present.     Mental Status: She is alert and oriented to person, place, and time.   Psychiatric:        Mood and Affect: Mood normal.        Behavior: Behavior normal.     Results: Bilateral breast MRI (03/08/2024): IMPRESSION: 1. Indeterminate 1.1 cm non-mass enhancement involving the RIGHT nipple extending into the subareolar location. The ribbon  shaped tissue marking clip placed at the time of the recent biopsy is at the inferolateral margin of the enhancement; however, the enhancement in this location would be discordant with pathology demonstrating benign ducts and focal UDH. 2. No MRI evidence of malignancy involving the LEFT breast.   RECOMMENDATION: Consider surgical consultation to discuss central duct excision and nipple exploration. The enhancement of the nipple and directly behind nipple is not amenable to MRI guided biopsy.   BI-RADS CATEGORY  4: Suspicious.  Right breast ultrasound biopsy (02/26/2024): Impression: 1.  Ultrasound-guided core biopsy of the palpable mass in the immediate retroareolar right breast/right nipple. 2.  Ribbon shaped biopsy marking clip at site of biopsy  of mass in the subareolar right breast/right nipple.  Addendum: Radiology and pathology concordance review.  Pathology:  Pathology results from the palpable mass in the right nipple/subareolar right breast reveal benign subareolar ducts with focal usual ductal hyperplasia.  Negative for atypia or malignancy.  Category: Benign  Rad-Path concordance: Concordant  Recommendation: Breast MRI with contrast is recommended given the new palpable area of concern involving the right nipple with questionable small mass seen in the nipple sonographically and this area was difficult to biopsy given the extremely superficial location.  The above radiologic-pathologic concordance and recommendations were reviewed by the attending radiologist prior to notifying the referring provider and patient.  The findings and recommendations were discussed with the patient at 8:55 AM on 02/28/2024.  The patient demonstrates understanding and agrees with the plan.  She is doing well postbiopsy with mild right breast soreness.  Right breast ultrasound (02/19/2024): Impression: 0.5 cm possible intraductal mass involving the right nipple/retroareolar right  breast.  Assessment: BI-RADS category: 4A-biopsy: Suspicious.  Low suspicion for malignancy.  Biopsy should be performed in the absence of clinical contraindication.  Recommendation laterality: Right  Ultrasound-guided core needle biopsy and clip placement is recommended.  The findings and recommendation were discussed in full with the patient.  Assessment & Plan:  Cindy Noble is a 68 y.o. female who presents for evaluation of a right breast subareolar mass.  -We discussed that her biopsy results and the MRI findings are discordant.  For this reason, it has been recommended that she proceeds with surgical excision of this nipple mass. -The risk and benefits of right breast lumpectomy were discussed including but not limited to bleeding, infection, injury to surrounding structures, need for additional procedures, ischemia or necrosis to the full.  After careful consideration, TAKIESHA MCDEVITT has decided to proceed with surgery. -Patient tentatively scheduled for surgery on 7/17 -We discussed the potential need for further surgeries pending her pathology results.  Patient is agreeable and understanding -Information provided to the patient regarding excisional breast biopsies  All questions were answered to the satisfaction of the patient.  Note: Portions of this report may have been transcribed using voice recognition software. Every effort has been made to ensure accuracy; however, inadvertent computerized transcription errors may still be present.   Dorothyann Brittle, DO [facility] [address] [place] [place], [place] [address] [phone] (office)

## 2024-04-23 ENCOUNTER — Encounter (HOSPITAL_COMMUNITY)
Admission: RE | Admit: 2024-04-23 | Discharge: 2024-04-23 | Disposition: A | Discharge status: Home / Self Care | Source: Ambulatory Visit | Attending: Surgery | Admitting: Surgery

## 2024-04-23 ENCOUNTER — Other Ambulatory Visit: Payer: Self-pay

## 2024-04-23 ENCOUNTER — Encounter (HOSPITAL_COMMUNITY): Payer: Self-pay

## 2024-04-23 DIAGNOSIS — K080 Exfoliation of teeth due to systemic causes: Secondary | ICD-10-CM | POA: Diagnosis not present

## 2024-04-23 NOTE — Patient Instructions (Signed)
 Cindy Noble  04/23/2024     @PREFPERIOPPHARMACY @   Your procedure is scheduled on  04/25/2024.   Report to [facility] at  0600 A.M.   Call this number if you have problems the morning of surgery:  [phone]  If you experience any cold or flu symptoms such as cough, fever, chills, shortness of breath, etc. between now and your scheduled surgery, please notify us  at the above number.   Remember:  Do not eat after midnight.   You may drink clear liquids until  0330 am on 04/25/2024.    Clear liquids allowed are:                    Water, Juice (No red color; non-citric and without pulp; diabetics please choose diet or no sugar options), Carbonated beverages (diabetics please choose diet or no sugar options), Clear Tea (No creamer, milk, or cream, including half & half and powdered creamer), Black Coffee Only (No creamer, milk or cream, including half & half and powdered creamer), and Clear Sports drink (No red color; diabetics please choose diet or no sugar options)    Take these medicines the morning of surgery with A SIP OF WATER                                                            None.    Do not wear jewelry, make-up or nail polish, including gel polish,  artificial nails, or any other type of covering on natural nails (fingers and  toes).  Do not wear lotions, powders, or perfumes, or deodorant.  Do not shave 48 hours prior to surgery.  Men may shave face and neck.  Do not bring valuables to the hospital.  [facility] is not responsible for any belongings or valuables.  Contacts, dentures or bridgework may not be worn into surgery.  Leave your suitcase in the car.  After surgery it may be brought to your room.  For patients admitted to the hospital, discharge time will be determined by your treatment team.  Patients discharged the day of surgery will not be allowed to drive home and must have someone with them for 24 hours.    Special instructions:    DO NOT smoke tobacco or vape for 24 hours before your procedure.  Please read over the following fact sheets that you were given. Pain Booklet, Coughing and Deep Breathing, Surgical Site Infection Prevention, Anesthesia Post-op Instructions, and Care and Recovery After Surgery       Breast Biopsy, Care After The following information offers guidance on how to care for yourself after your breast biopsy. Your doctor may also give you more specific instructions. If you have problems or questions, contact your doctor. What can I expect after the procedure? After a breast biopsy, it is common to have: Bruising on your breast. Breast swelling. Numbness, tingling, or pain near your biopsy site. This site is where tissue was taken out for study. Follow these instructions at home: Medicines Take over-the-counter and prescription medicines only as told by your doctor. If you were given a sedative during your procedure, do not drive or use machines until your doctor says that it is safe. A sedative is a medicine that helps you  relax. Do not drink alcohol while taking pain medicine. Ask your doctor if you should avoid driving or using machines while you are taking your medicine. Biopsy site care     Follow instructions from your doctor about how to take care of your cut from surgery (incision) or your puncture site. Make sure you: Wash your hands with soap and water for at least 20 seconds before and after you change your bandage. If you cannot use soap and water, use hand sanitizer. Change your bandage. Leave stitches or skin glue in place for at least 2 weeks. Leave tape strips alone unless you are told to take them off. You may trim the edges of the tape strips if they curl up. If you have stitches, keep them dry when you take a bath or a shower. Check your cut or puncture site every day for signs of infection. Look for: More redness, swelling, or pain. More fluid or blood. Warmth. Pus or a  bad smell. Protect the biopsy site. Do not let the site get bumped. Managing pain If told, put ice on the biopsy site. To do this: Put ice in a plastic bag. Place a towel between your skin and the bag. Leave the ice on for 20 minutes, 2-3 times a day. Take off the ice if your skin turns bright red. This is very important. If you cannot feel pain, heat, or cold, you have a greater risk of damage to the area. Activity If a cut was made in your skin to do the biopsy, avoid activities that could pull your cut open. These include: Stretching. Reaching over your head. Exercise. Sports. Lifting anything that weighs more than 3 lb (1.4 kg). Return to your normal activities when your doctor says that it is safe. General instructions Follow your normal diet. Wear a good support bra for as long as told by your doctor. Get checked for extra fluid around your lymph nodes (lymphedema) as often as told. Do not smoke or use any products that contain nicotine or tobacco. If you need help quitting, ask your doctor. Keep all follow-up visits. Contact a doctor if: You notice any of these at or near the biopsy site: More redness, swelling, or pain. More fluid or blood. Warmth. Pus or a bad smell. The site breaking open after the stitches or skin tape strips have been removed. You have a rash or a fever. Get help right away if: You have trouble breathing. You have red streaks around the biopsy site. Summary After a breast biopsy, it is common to have bruising, numbness, tingling, or pain near your biopsy site. Ask your doctor if you should avoid driving or using machines while you are taking your medicine. If you had a cut made in your skin to do the biopsy, avoid activities that may pull the cut open. Return to your normal activities when your doctor says that it is safe. Wear a good support bra for as long as told by your doctor. This information is not intended to replace advice given to you by  your health care provider. Make sure you discuss any questions you have with your health care provider. Document Revised: 07/21/2021 Document Reviewed: 07/21/2021 Elsevier Patient Education  2024 Else[employer] Inc.General Anesthesia, Adult, Care After The following information offers guidance on how to care for yourself after your procedure. Your health care provider may also give you more specific instructions. If you have problems or questions, contact your health care provider. What can I expect  after the procedure? After the procedure, it is common for people to: Have pain or discomfort at the IV site. Have nausea or vomiting. Have a sore throat or hoarseness. Have trouble concentrating. Feel cold or chills. Feel weak, sleepy, or tired (fatigue). Have soreness and body aches. These can affect parts of the body that were not involved in surgery. Follow these instructions at home: For the time period you were told by your health care provider:  Rest. Do not participate in activities where you could fall or become injured. Do not drive or use machinery. Do not drink alcohol. Do not take sleeping pills or medicines that cause drowsiness. Do not make important decisions or sign legal documents. Do not take care of children on your own. General instructions Drink enough fluid to keep your urine pale yellow. If you have sleep apnea, surgery and certain medicines can increase your risk for breathing problems. Follow instructions from your health care provider about wearing your sleep device: Anytime you are sleeping, including during daytime naps. While taking prescription pain medicines, sleeping medicines, or medicines that make you drowsy. Return to your normal activities as told by your health care provider. Ask your health care provider what activities are safe for you. Take over-the-counter and prescription medicines only as told by your health care provider. Do not use any products that  contain nicotine or tobacco. These products include cigarettes, chewing tobacco, and vaping devices, such as e-cigarettes. These can delay incision healing after surgery. If you need help quitting, ask your health care provider. Contact a health care provider if: You have nausea or vomiting that does not get better with medicine. You vomit every time you eat or drink. You have pain that does not get better with medicine. You cannot urinate or have bloody urine. You develop a skin rash. You have a fever. Get help right away if: You have trouble breathing. You have chest pain. You vomit blood. These symptoms may be an emergency. Get help right away. Call 911. Do not wait to see if the symptoms will go away. Do not drive yourself to the hospital. Summary After the procedure, it is common to have a sore throat, hoarseness, nausea, vomiting, or to feel weak, sleepy, or fatigue. For the time period you were told by your health care provider, do not drive or use machinery. Get help right away if you have difficulty breathing, have chest pain, or vomit blood. These symptoms may be an emergency. This information is not intended to replace advice given to you by your health care provider. Make sure you discuss any questions you have with your health care provider. Document Revised: 12/24/2021 Document Reviewed: 12/24/2021 Elsevier Patient Education  2024 Elsevier Inc.How to Use Chlorhexidine  at Home in the Shower Chlorhexidine  gluconate (CHG) is a germ-killing (antiseptic) wash that's used to clean the skin. It can get rid of the germs that normally live on the skin and can keep them away for about 24 hours. If you're having surgery, you may be told to shower with CHG at home the night before surgery. This can help lower your risk for infection. To use CHG wash in the shower, follow the steps below. Supplies needed: CHG body wash. Clean washcloth. Clean towel. How to use CHG in the shower Follow  these steps unless you're told to use CHG in a different way: Start the shower. Use your normal soap and shampoo to wash your face and hair. Turn off the shower or move out  of the shower stream. Pour CHG onto a clean washcloth. Do not use any type of brush or rough sponge. Start at your neck, washing your body down to your toes. Make sure you: Wash the part of your body where the surgery will be done for at least 1 minute. Do not scrub. Do not use CHG on your head or face unless your health care provider tells you to. If it gets into your ears or eyes, rinse them well with water. Do not wash your genitals with CHG. Wash your back and under your arms. Make sure to wash skin folds. Let the CHG sit on your skin for 1-2 minutes or as long as told. Rinse your entire body in the shower, including all body creases and folds. Turn off the shower. Dry off with a clean towel. Do not put anything on your skin afterward, such as powder, lotion, or perfume. Put on clean clothes or pajamas. If it's the night before surgery, sleep in clean sheets. General tips Use CHG only as told, and follow the instructions on the label. Use the full amount of CHG as told. This is often one bottle. Do not smoke and stay away from flames after using CHG. Your skin may feel sticky after using CHG. This is normal. The sticky feeling will go away as the CHG dries. Do not use CHG: If you have a chlorhexidine  allergy or have reacted to chlorhexidine  in the past. On open wounds or areas of skin that have broken skin, cuts, or scrapes. On babies younger than 58 months of age. Contact a health care provider if: You have questions about using CHG. Your skin gets irritated or itchy. You have a rash after using CHG. You swallow any CHG. Call your local poison control center [phone] in the [place] Your eyes itch badly, or they become very red or swollen. Your hearing changes. You have trouble seeing. If you can't reach  your provider, go to an urgent care or emergency room. Do not drive yourself. Get help right away if: You have swelling or tingling in your mouth or throat. You make high-pitched whistling sounds when you breathe, most often when you breathe out (wheeze). You have trouble breathing. These symptoms may be an emergency. Call 911 right away. Do not wait to see if the symptoms will go away. Do not drive yourself to the hospital. This information is not intended to replace advice given to you by your health care provider. Make sure you discuss any questions you have with your health care provider. Document Revised: 04/11/2023 Document Reviewed: 04/07/2022 Elsevier Patient Education  2024 [employer].

## 2024-04-23 NOTE — Pre-Procedure Instructions (Signed)
 Attempted pre-op phonecall. Left VM for her to call us back.

## 2024-04-24 NOTE — Anesthesia Preprocedure Evaluation (Addendum)
 Anesthesia Evaluation  Patient identified by MRN, date of birth, ID band Patient awake    Reviewed: Allergy & Precautions, H&P , NPO status , Patient's Chart, lab work & pertinent test results, reviewed documented beta blocker date and time   Airway Mallampati: II  TM Distance: >3 FB Neck ROM: full    Dental no notable dental hx. (+) Dental Advisory Given, Teeth Intact   Pulmonary neg pulmonary ROS   Pulmonary exam normal breath sounds clear to auscultation       Cardiovascular Exercise Tolerance: Good negative cardio ROS Normal cardiovascular exam Rhythm:regular Rate:Normal     Neuro/Psych negative neurological ROS  negative psych ROS   GI/Hepatic negative GI ROS, Neg liver ROS,,,  Endo/Other  negative endocrine ROS    Renal/GU negative Renal ROS  negative genitourinary   Musculoskeletal   Abdominal   Peds  Hematology negative hematology ROS (+)   Anesthesia Other Findings   Reproductive/Obstetrics negative OB ROS                              Anesthesia Physical Anesthesia Plan  ASA: 1  Anesthesia Plan: General   Post-op Pain Management: Minimal or no pain anticipated   Induction: Intravenous  PONV Risk Score and Plan: Ondansetron , Dexamethasone  and Midazolam   Airway Management Planned: LMA  Additional Equipment: None  Intra-op Plan:   Post-operative Plan:   Informed Consent: I have reviewed the patients History and Physical, chart, labs and discussed the procedure including the risks, benefits and alternatives for the proposed anesthesia with the patient or authorized representative who has indicated his/her understanding and acceptance.     Dental Advisory Given  Plan Discussed with: CRNA  Anesthesia Plan Comments:         Anesthesia Quick Evaluation

## 2024-04-25 ENCOUNTER — Ambulatory Visit (HOSPITAL_COMMUNITY)

## 2024-04-25 ENCOUNTER — Ambulatory Visit (HOSPITAL_COMMUNITY)
Admission: RE | Admit: 2024-04-25 | Discharge: 2024-04-25 | Disposition: A | Discharge status: Home / Self Care | Attending: Surgery | Admitting: Surgery

## 2024-04-25 ENCOUNTER — Encounter (HOSPITAL_COMMUNITY): Payer: Self-pay | Admitting: Anesthesiology

## 2024-04-25 ENCOUNTER — Encounter (HOSPITAL_COMMUNITY)
Admission: RE | Disposition: A | Discharge status: Home / Self Care | Payer: Self-pay | Source: Home / Self Care | Attending: Surgery

## 2024-04-25 ENCOUNTER — Ambulatory Visit (HOSPITAL_BASED_OUTPATIENT_CLINIC_OR_DEPARTMENT_OTHER): Payer: Self-pay | Admitting: Anesthesiology

## 2024-04-25 ENCOUNTER — Other Ambulatory Visit: Payer: Self-pay

## 2024-04-25 ENCOUNTER — Encounter (HOSPITAL_COMMUNITY): Payer: Self-pay | Admitting: Surgery

## 2024-04-25 DIAGNOSIS — N6341 Unspecified lump in right breast, subareolar: Secondary | ICD-10-CM

## 2024-04-25 DIAGNOSIS — N6031 Fibrosclerosis of right breast: Secondary | ICD-10-CM | POA: Insufficient documentation

## 2024-04-25 DIAGNOSIS — N6489 Other specified disorders of breast: Secondary | ICD-10-CM | POA: Diagnosis not present

## 2024-04-25 DIAGNOSIS — N649 Disorder of breast, unspecified: Secondary | ICD-10-CM | POA: Diagnosis not present

## 2024-04-25 DIAGNOSIS — Z803 Family history of malignant neoplasm of breast: Secondary | ICD-10-CM | POA: Insufficient documentation

## 2024-04-25 HISTORY — PX: EXCISION OF BREAST BIOPSY: SHX5822

## 2024-04-25 SURGERY — EXCISION OF BREAST BIOPSY
Anesthesia: General | Site: Breast | Laterality: Right

## 2024-04-25 MED ORDER — ORAL CARE MOUTH RINSE: 15.0000 mL | Freq: Once | OROMUCOSAL | Status: AC

## 2024-04-25 MED ORDER — CHLORHEXIDINE GLUCONATE 0.12 % MT SOLN
15.0000 mL | Freq: Once | OROMUCOSAL | Status: AC
  Administered 2024-04-25: 15 mL via OROMUCOSAL

## 2024-04-25 MED ORDER — LACTATED RINGERS IV SOLN: INTRAVENOUS | Status: DC

## 2024-04-25 MED ORDER — LIDOCAINE 2% (20 MG/ML) 5 ML SYRINGE
INTRAMUSCULAR | Status: AC
  Filled 2024-04-25: qty 5

## 2024-04-25 MED ORDER — ONDANSETRON HCL 4 MG/2ML IJ SOLN
INTRAMUSCULAR | Status: DC | PRN
  Administered 2024-04-25: 4 mg via INTRAVENOUS

## 2024-04-25 MED ORDER — MIDAZOLAM HCL 2 MG/2ML IJ SOLN
INTRAMUSCULAR | Status: AC
  Filled 2024-04-25: qty 2

## 2024-04-25 MED ORDER — PROPOFOL 10 MG/ML IV BOLUS
INTRAVENOUS | Status: AC
  Filled 2024-04-25: qty 20

## 2024-04-25 MED ORDER — ACETAMINOPHEN 500 MG PO TABS: 1000.0000 mg | ORAL_TABLET | Freq: Four times a day (QID) | ORAL | 0 refills | Status: AC

## 2024-04-25 MED ORDER — BUPIVACAINE HCL (PF) 0.5 % IJ SOLN
INTRAMUSCULAR | Status: AC
  Filled 2024-04-25: qty 30

## 2024-04-25 MED ORDER — BUPIVACAINE HCL (PF) 0.5 % IJ SOLN
INTRAMUSCULAR | Status: DC | PRN
Start: 2024-04-25 — End: 2024-04-25
  Administered 2024-04-25: 30 mL

## 2024-04-25 MED ORDER — DEXAMETHASONE SODIUM PHOSPHATE 10 MG/ML IJ SOLN
INTRAMUSCULAR | Status: AC
  Filled 2024-04-25: qty 1

## 2024-04-25 MED ORDER — OXYCODONE HCL 5 MG PO TABS
5.0000 mg | ORAL_TABLET | Freq: Four times a day (QID) | ORAL | 0 refills | Status: DC | PRN
Start: 2024-04-25 — End: 2024-05-09

## 2024-04-25 MED ORDER — CHLORHEXIDINE GLUCONATE CLOTH 2 % EX PADS: 6.0000 | MEDICATED_PAD | Freq: Once | CUTANEOUS | Status: DC

## 2024-04-25 MED ORDER — OXYCODONE HCL 5 MG/5ML PO SOLN: 5.0000 mg | Freq: Once | ORAL | Status: DC | PRN

## 2024-04-25 MED ORDER — SEVOFLURANE IN SOLN
RESPIRATORY_TRACT | Status: AC
Start: 2024-04-25 — End: 2024-04-25
  Filled 2024-04-25: qty 500

## 2024-04-25 MED ORDER — DEXAMETHASONE SODIUM PHOSPHATE 10 MG/ML IJ SOLN
INTRAMUSCULAR | Status: DC | PRN
  Administered 2024-04-25: 10 mg via INTRAVENOUS

## 2024-04-25 MED ORDER — OXYCODONE HCL 5 MG PO TABS: 5.0000 mg | ORAL_TABLET | Freq: Once | ORAL | Status: DC | PRN

## 2024-04-25 MED ORDER — MIDAZOLAM HCL 2 MG/2ML IJ SOLN
INTRAMUSCULAR | Status: DC | PRN
  Administered 2024-04-25: 2 mg via INTRAVENOUS

## 2024-04-25 MED ORDER — LIDOCAINE 2% (20 MG/ML) 5 ML SYRINGE
INTRAMUSCULAR | Status: DC | PRN
  Administered 2024-04-25: 60 mg via INTRAVENOUS

## 2024-04-25 MED ORDER — CEFAZOLIN SODIUM-DEXTROSE 2-4 GM/100ML-% IV SOLN
2.0000 g | INTRAVENOUS | Status: AC
  Administered 2024-04-25: 2 g via INTRAVENOUS

## 2024-04-25 MED ORDER — DOCUSATE SODIUM 100 MG PO CAPS: 100.0000 mg | ORAL_CAPSULE | Freq: Two times a day (BID) | ORAL | 2 refills | Status: DC

## 2024-04-25 MED ORDER — PROPOFOL 10 MG/ML IV BOLUS
INTRAVENOUS | Status: DC | PRN
  Administered 2024-04-25: 110 mg via INTRAVENOUS

## 2024-04-25 MED ORDER — FENTANYL CITRATE (PF) 250 MCG/5ML IJ SOLN
INTRAMUSCULAR | Status: DC | PRN
  Administered 2024-04-25 (×4): 25 ug via INTRAVENOUS

## 2024-04-25 MED ORDER — SODIUM CHLORIDE 0.9 % IV SOLN
12.5000 mg | INTRAVENOUS | Status: DC | PRN
  Filled 2024-04-25: qty 0.5

## 2024-04-25 MED ORDER — ONDANSETRON HCL 4 MG/2ML IJ SOLN
INTRAMUSCULAR | Status: AC
Start: 2024-04-25 — End: 2024-04-25
  Filled 2024-04-25: qty 2

## 2024-04-25 MED ORDER — SEVOFLURANE IN SOLN
RESPIRATORY_TRACT | Status: AC
  Filled 2024-04-25: qty 250

## 2024-04-25 MED ORDER — 0.9 % SODIUM CHLORIDE (POUR BTL) OPTIME
TOPICAL | Status: DC | PRN
  Administered 2024-04-25: 1000 mL

## 2024-04-25 MED ORDER — SEVOFLURANE IN SOLN
RESPIRATORY_TRACT | Status: AC
  Filled 2024-04-25: qty 500

## 2024-04-25 MED ORDER — CHLORHEXIDINE GLUCONATE CLOTH 2 % EX PADS
6.0000 | MEDICATED_PAD | Freq: Once | CUTANEOUS | Status: DC
  Administered 2024-04-25: 6 via TOPICAL

## 2024-04-25 MED ORDER — FENTANYL CITRATE PF 50 MCG/ML IJ SOSY: 25.0000 ug | PREFILLED_SYRINGE | INTRAMUSCULAR | Status: DC | PRN

## 2024-04-25 MED ORDER — FENTANYL CITRATE (PF) 100 MCG/2ML IJ SOLN
INTRAMUSCULAR | Status: AC
  Filled 2024-04-25: qty 2

## 2024-04-25 SURGICAL SUPPLY — 26 items
CHLORAPREP W/TINT 26 (MISCELLANEOUS) ×1 IMPLANT
CLOTH BEACON ORANGE TIMEOUT ST (SAFETY) ×1 IMPLANT
COVER LIGHT HANDLE STERIS (MISCELLANEOUS) ×2 IMPLANT
DERMABOND ADVANCED .7 DNX12 (GAUZE/BANDAGES/DRESSINGS) ×1 IMPLANT
ELECTRODE REM PT RTRN 9FT ADLT (ELECTROSURGICAL) ×1 IMPLANT
GLOVE BIOGEL PI IND STRL 6.5 (GLOVE) ×1 IMPLANT
GLOVE BIOGEL PI IND STRL 7.0 (GLOVE) ×2 IMPLANT
GLOVE BIOGEL PI IND STRL 8 (GLOVE) IMPLANT
GLOVE SURG SS PI 6.5 STRL IVOR (GLOVE) ×3 IMPLANT
GLOVE SURG SS PI 7.5 STRL IVOR (GLOVE) IMPLANT
GOWN STRL REUS W/TWL LRG LVL3 (GOWN DISPOSABLE) ×2 IMPLANT
KIT MARKER MARGIN INK (KITS) IMPLANT
KIT TURNOVER KIT A (KITS) ×1 IMPLANT
MANIFOLD NEPTUNE II (INSTRUMENTS) ×1 IMPLANT
NDL HYPO 25X1 1.5 SAFETY (NEEDLE) ×1 IMPLANT
NEEDLE HYPO 25X1 1.5 SAFETY (NEEDLE) ×1 IMPLANT
NS IRRIG 1000ML POUR BTL (IV SOLUTION) ×1 IMPLANT
PACK MINOR (CUSTOM PROCEDURE TRAY) ×1 IMPLANT
PAD ARMBOARD POSITIONER FOAM (MISCELLANEOUS) ×1 IMPLANT
POSITIONER HEAD 8X9X4 ADT (SOFTGOODS) ×1 IMPLANT
SET BASIN LINEN APH (SET/KITS/TRAYS/PACK) ×1 IMPLANT
SUT MNCRL AB 4-0 PS2 18 (SUTURE) ×1 IMPLANT
SUT SILK 2 0 SH (SUTURE) IMPLANT
SUT VIC AB 3-0 SH 27X BRD (SUTURE) ×1 IMPLANT
SYR 30ML LL (SYRINGE) IMPLANT
SYR BULB IRRIG 60ML STRL (SYRINGE) IMPLANT

## 2024-04-25 NOTE — Progress Notes (Signed)
 [facility]  Spoke with the patient's husband in the consultation room.  I explained that she tolerated the procedure without difficulty.  She has dissolvable stitches under the skin with overlying skin glue.  This will flake off in 10 to 14 days.  I discharged her home with a prescription for narcotic pain medication that they should take as needed for pain.  I also want her taking scheduled Tylenol.  If they take the narcotic pain medication, they should take a stool softener as well.  The patient will follow-up with me in 2 weeks.  All questions were answered to his expressed satisfaction.  Theophilus Kinds, DO [facility] [address] [place], [place] [address] [phone] (office)

## 2024-04-25 NOTE — Op Note (Addendum)
 [facility] Operative Note  04/25/24  Preoperative Diagnosis: Right breast subareolar mass   Postoperative Diagnosis: Same   Procedure(s) Performed: Excisional biopsy of right breast subareolar mass   Surgeon: Dorothyann Brittle, DO    Assistants: Spurgeon Clause, MS3    Anesthesia: LMA   Anesthesiologist: Landry Dunnings, MD    Specimens: right breast biopsy, Superiornedial margin with suture at superior margin   Estimated Blood Loss: Minimal   Blood Replacement: None    Complications: None   Wound Class: Clean   Operative Indications: Patient is a 69 year old female who presents for excision of her right subareolar mass.  She underwent a right subareolar ultrasound-guided biopsy which demonstrated benign subareolar ducts with focal usual ductal hyperplasia.  She subsequently noted a new palpable area just below the nipple which was too superficial for biopsy, so she was referred to surgery for excisional biopsy.  Patient is agreeable to surgery at this time.  All risks and benefits of performing this procedure were discussed with the patient including pain, infection, bleeding, damage to the surrounding structures, need for more procedures or surgery, and ischemia or necrosis to the nipple. The patient voiced understanding of the procedure, all questions were sought and answered, and consent was obtained.  Findings: -Palpable right subareolar mass removed, oriented with painting system; previous biopsy clip not noted within specimen (this biopsy clip was in regards to a different area of concern)   Procedure: The patient was taken to the operating room and placed supine.  LMA anesthesia was induced. Intravenous antibiotics were administered per protocol. The right breast was prepared and draped in the usual sterile fashion.  A time-out was completed verifying correct patient, procedure, site, positioning, and implant(s) and/or special equipment prior to beginning  this procedure.  Palpable mass was noted at the 7 o'clock position in the right subareolar region.  A curvilinear incision was made along the inferior aspect of the nipple.  Flaps were raised and the subareolar mass was noted to be palpable within the specimen. A ball of tissue was taken surrounding this area.  The specimen was oriented using our painting system. This specimen was then sent to radiology, and biopsy clip from a different location was not noted to be within the specimen.  The specimen was then sent to pathology for evaluation.  There was an additional palpable abnormality at the superiomedial area of the cavity which was removed.  This was marked with a stitch at the superior edge, and also send to pathology for evaluation.  Electrocautery was used to achieve hemostasis.  Marcaine  was instilled throughout the incision site.  Dermis was closed with interrupted 3-0 Vicryl subcuticular stitches, and skin was closed with 4-0 Monocryl in a running subcuticular fashion.  The incision was dressed with Dermabond.  A compression bra was placed on the patient.  Final inspection revealed acceptable hemostasis. All counts were correct at the end of the case. The patient was awakened from anesthesia without complication.  The patient went to the PACU in stable condition.   Dorothyann Brittle, DO  [facility] [address] [place], [place] [address] [phone] (office)

## 2024-04-25 NOTE — Anesthesia Postprocedure Evaluation (Signed)
 Anesthesia Post Note  Patient: Cindy Noble  Procedure(s) Performed: EXCISIONAL BREAST BIOPSY (Right: Breast)  Patient location during evaluation: PACU Anesthesia Type: General Level of consciousness: awake and alert Pain management: pain level controlled Vital Signs Assessment: post-procedure vital signs reviewed and stable Respiratory status: spontaneous breathing, nonlabored ventilation, respiratory function stable and patient connected to nasal cannula oxygen Cardiovascular status: blood pressure returned to baseline and stable Postop Assessment: no apparent nausea or vomiting Anesthetic complications: no   There were no known notable events for this encounter.   Last Vitals:  Vitals:   04/25/24 0900 04/25/24 0915  BP: (!) 152/78   Pulse: 75 66  Resp: 17 14  Temp:    SpO2: 99% 97%    Last Pain:  Vitals:   04/25/24 0915  TempSrc:   PainSc: 0-No pain                 Leeanna Slaby L Chiquitta Matty

## 2024-04-25 NOTE — Anesthesia Procedure Notes (Signed)
 Procedure Name: LMA Insertion Date/Time: 04/25/2024 7:34 AM  Performed by: Elaine Delon CROME, CRNAPre-anesthesia Checklist: Emergency Drugs available, Patient identified, Suction available and Patient being monitored Patient Re-evaluated:Patient Re-evaluated prior to induction Oxygen Delivery Method: Circle system utilized Preoxygenation: Pre-oxygenation with 100% oxygen Induction Type: IV induction LMA: LMA inserted LMA Size: 3.0 Number of attempts: 1 Placement Confirmation: positive ETCO2 and breath sounds checked- equal and bilateral Tube secured with: Tape Dental Injury: Teeth and Oropharynx as per pre-operative assessment

## 2024-04-25 NOTE — Interval H&P Note (Signed)
 History and Physical Interval Note:  04/25/2024 7:23 AM  Cindy Noble  has presented today for surgery, with the diagnosis of BREAST MASS, SUBAREOLAR, RIGHT.  The various methods of treatment have been discussed with the patient and family. After consideration of risks, benefits and other options for treatment, the patient has consented to  Procedure(s): EXCISION OF BREAST BIOPSY (Right) as a surgical intervention.  The patient's history has been reviewed, patient examined, no change in status, stable for surgery.  I have reviewed the patient's chart and labs.  Questions were answered to the patient's satisfaction.     Keiera Strathman A Joron Velis

## 2024-04-25 NOTE — Discharge Instructions (Addendum)
 Ambulatory Surgery Discharge Instructions  General Anesthesia or Sedation Do not drive or operate heavy machinery for 24 hours.  Do not consume alcohol, tranquilizers, sleeping medications, or any non-prescribed medications for 24 hours. Do not make important decisions or sign any important papers in the next 24 hours. You should have someone with you tonight at home.  Activity  You are advised to go directly home from the hospital.  Restrict your activities and rest for a day.  Resume light activity tomorrow. No heavy lifting over 10 lbs or strenuous exercise.  Wear a compression bra for the next 1-2 weeks while up and moving.  Fluids and Diet Begin with clear liquids, bouillon, dry toast, soda crackers.  If not nauseated, you may go to a regular diet when you desire.  Greasy and spicy foods are not advised.  Medications  If you have not had a bowel movement in 24 hours, take 2 tablespoons over the counter Milk of mag.             You May resume your blood thinners tomorrow (Aspirin, coumadin, or other).  You are being discharged with prescriptions for Opioid/Narcotic Medications: There are some specific considerations for these medications that you should know. Opioid Meds have risks & benefits. Addiction to these meds is always a concern with prolonged use Take medication only as directed Do not drive while taking narcotic pain medication Do not crush tablets or capsules Do not use a different container than medication was dispensed in Lock the container of medication in a cool, dry place out of reach of children and pets. Opioid medication can cause addiction Do not share with anyone else (this is a felony) Do not store medications for future use. Dispose of them properly.     Disposal:  Find a   household drug take back site near you.  If you can't get to a drug take back site, use the recipe below as a last resort to dispose of expired, unused or unwanted  drugs. Disposal  (Do not dispose chemotherapy drugs this way, talk to your prescribing doctor instead.) Step 1: Mix drugs (do not crush) with dirt, kitty litter, or used coffee grounds and add a small amount of water to dissolve any solid medications. Step 2: Seal drugs in plastic bag. Step 3: Place plastic bag in trash. Step 4: Take prescription container and scratch out personal information, then recycle or throw away.  Operative Site  You have a liquid bandage over your incisions, this will begin to flake off in about a week. Ok to English as a second language teacher. Keep wound clean and dry. No baths or swimming. No lifting more than 10 pounds.  Contact Information: If you have questions or concerns, please call our office, [phone], Monday- Thursday 8AM-5PM and Friday 8AM-12Noon.  If it is after hours or on the weekend, please call [facility], [phone], and ask to speak to the surgeon on call for Dr. Evonnie at [facility].   SPECIFIC COMPLICATIONS TO WATCH FOR: Inability to urinate Fever over 101? F by mouth Nausea and vomiting lasting longer than 24 hours. Pain not relieved by medication ordered Swelling around the operative site Increased redness, warmth, hardness, around operative area Numbness, tingling, or cold fingers or toes Blood -soaked dressing, (small amounts of oozing may be normal) Increasing and progressive drainage from surgical area or exam site  Post Anesthesia Home Care Instructions  Activity: Get plenty of rest for the remainder of the day. A responsible individual must  stay with you for 24 hours following the procedure.  For the next 24 hours, DO NOT: -Drive a car -Advertising copywriter -Drink alcoholic beverages -Take any medication unless instructed by your physician -Make any legal decisions or sign important papers.  Meals: Start with liquid foods such as gelatin or soup. Progress to regular foods as tolerated. Avoid greasy, spicy, heavy foods. If nausea  and/or vomiting occur, drink only clear liquids until the nausea and/or vomiting subsides. Call your physician if vomiting continues.  Special Instructions/Symptoms: Your throat may feel dry or sore from the anesthesia or the breathing tube placed in your throat during surgery. If this causes discomfort, gargle with warm salt water. The discomfort should disappear within 24 hours.

## 2024-04-25 NOTE — Transfer of Care (Signed)
 Immediate Anesthesia Transfer of Care Note  Patient: Cindy Noble  Procedure(s) Performed: EXCISIONAL BREAST BIOPSY (Right: Breast)  Patient Location: PACU  Anesthesia Type:General  Level of Consciousness: awake  Airway & Oxygen Therapy: Patient Spontanous Breathing  Post-op Assessment: Report given to RN and Post -op Vital signs reviewed and stable  Post vital signs: Reviewed and stable  Last Vitals:  Vitals Value Taken Time  BP 141/69   Temp 97.6   Pulse 76 04/25/24 08:47  Resp 12 04/25/24 08:47  SpO2 94 % 04/25/24 08:47  Vitals shown include unfiled device data.  Last Pain:  Vitals:   04/25/24 0635  TempSrc: Oral  PainSc: 0-No pain      Patients Stated Pain Goal: 7 (04/25/24 9364)  Complications: No notable events documented.

## 2024-04-26 ENCOUNTER — Encounter (HOSPITAL_COMMUNITY): Payer: Self-pay | Admitting: Surgery

## 2024-04-29 LAB — SURGICAL PATHOLOGY

## 2024-05-09 ENCOUNTER — Encounter: Payer: Self-pay | Admitting: Surgery

## 2024-05-09 ENCOUNTER — Ambulatory Visit: Admitting: Surgery

## 2024-05-09 VITALS — BP 120/64 | HR 69 | Temp 97.8°F | Resp 14 | Ht 61.0 in | Wt 147.0 lb

## 2024-05-09 DIAGNOSIS — Z09 Encounter for follow-up examination after completed treatment for conditions other than malignant neoplasm: Secondary | ICD-10-CM

## 2024-05-10 NOTE — Progress Notes (Signed)
 [name]   HPI:  [name] presents to clinic for follow-up status post excisional biopsy of right breast subareolar mass on 7/17.  Overall the patient has been doing well since the surgery, but she has noted a little bit more tenderness around the incision site this past week.  She denies any nipple drainage.  She denies any issues at her incision sites.  Skin glue remains in place.  She denies fevers and chills.  Review of Systems:  All other review of systems: otherwise negative   Vital Signs:  BP 120/64   Pulse 69   Temp 97.8 F (36.6 C) (Oral)   Resp 14   Ht 5' 1 (1.549 m)   Wt 147 lb (66.7 kg)   LMP 10/10/2006 (Approximate)   SpO2 96%   BMI 27.78 kg/m    Physical Exam:  Physical Exam Vitals reviewed.  Constitutional:      Appearance: Normal appearance.  Chest:     Comments: Right breast incision site healing well with Dermabond in place, palpable fullness underlying incision, likely underlying seroma, minimal tenderness to palpation Neurological:     Mental Status: She is alert.     Laboratory studies: None  Imaging:  None  Pathology: A. BREAST, RIGHT, EXCISION:       Benign breast tissue with dense stromal fibrosis and microcysts.       Negative for atypia or malignancy.   B. BREAST, SUPERIORMEDIAL MARGIN, EXCISION:       Benign breast tissue with dense stromal fibrosis.       Negative for atypia or malignancy.   Assessment:  68 y.o. yo Female who presents for follow-up status post excisional biopsy of right breast subareolar mass 7/17.  Plan:  - We discussed that the palpable underlying fullness is most likely a seroma.  We will allow this area to fully heal, and reevaluate if she still feels the mass that she felt prior to surgery -We discussed that she may need further imaging or surgeries in the future versus excision of the nipple if there is still clinical concern for this area that was previously evaluated on  imaging - Advised that she can use antibiotic ointment at the incision site to help get the remaining skin glue off - Follow up with me in 1 month  All of the above recommendations were discussed with the patient, and all of patient's questions were answered to her expressed satisfaction.  Note: Portions of this report may have been transcribed using voice recognition software. Every effort has been made to ensure accuracy; however, inadvertent computerized transcription errors may still be present.   Cindy Brittle, DO [facility] [address], [place] [address] [phone] (office)

## 2024-05-14 DIAGNOSIS — D229 Melanocytic nevi, unspecified: Secondary | ICD-10-CM | POA: Diagnosis not present

## 2024-05-14 DIAGNOSIS — L814 Other melanin hyperpigmentation: Secondary | ICD-10-CM | POA: Diagnosis not present

## 2024-05-14 DIAGNOSIS — C44719 Basal cell carcinoma of skin of left lower limb, including hip: Secondary | ICD-10-CM | POA: Diagnosis not present

## 2024-05-14 DIAGNOSIS — D485 Neoplasm of uncertain behavior of skin: Secondary | ICD-10-CM | POA: Diagnosis not present

## 2024-05-14 DIAGNOSIS — L821 Other seborrheic keratosis: Secondary | ICD-10-CM | POA: Diagnosis not present

## 2024-05-14 DIAGNOSIS — L820 Inflamed seborrheic keratosis: Secondary | ICD-10-CM | POA: Diagnosis not present

## 2024-05-14 DIAGNOSIS — L570 Actinic keratosis: Secondary | ICD-10-CM | POA: Diagnosis not present

## 2024-05-14 DIAGNOSIS — L578 Other skin changes due to chronic exposure to nonionizing radiation: Secondary | ICD-10-CM | POA: Diagnosis not present

## 2024-05-28 DIAGNOSIS — C44719 Basal cell carcinoma of skin of left lower limb, including hip: Secondary | ICD-10-CM | POA: Diagnosis not present

## 2024-06-05 ENCOUNTER — Encounter: Admitting: Surgery

## 2024-06-13 ENCOUNTER — Encounter: Payer: Self-pay | Admitting: Surgery

## 2024-06-13 ENCOUNTER — Ambulatory Visit (INDEPENDENT_AMBULATORY_CARE_PROVIDER_SITE_OTHER): Admitting: Surgery

## 2024-06-13 VITALS — BP 138/81 | HR 72 | Temp 97.5°F | Resp 16 | Ht 61.0 in | Wt 148.0 lb

## 2024-06-13 DIAGNOSIS — Z09 Encounter for follow-up examination after completed treatment for conditions other than malignant neoplasm: Secondary | ICD-10-CM

## 2024-06-13 NOTE — Progress Notes (Unsigned)
 [name]   HPI:  [name] presents to clinic for post-op follow-up s/p excisional biopsy of right breast subareolar mass on 7/17.  Since her last visit, the patient has been doing well.  She only intermittently has shooting pain going through the area, but her tenderness continues to improve with time.  She feels a fullness below her nipple and on the underside along her incision site, and she is unsure if this is the previously palpable mass or if this is related to scar tissue.  She denies fevers and chills.  She denies issues with her incision site.  Review of Systems:  All other review of systems: otherwise negative   Vital Signs:  BP 138/81   Pulse 72   Temp (!) 97.5 F (36.4 C) (Oral)   Resp 16   Ht 5' 1 (1.549 m)   Wt 148 lb (67.1 kg)   LMP 10/10/2006 (Approximate)   SpO2 97%   BMI 27.96 kg/m    Physical Exam:  Physical Exam Vitals reviewed.  Constitutional:      Appearance: Normal appearance.  Chest:     Comments: Right breast surgical site well-healed with underlying hard scar tissue, a little bit of firmness under nipple, likely again related to scar tissue Neurological:     Mental Status: She is alert.     Laboratory studies: None   Imaging:  None   Assessment:  68 y.o. yo Female who presents for follow-up status post excisional biopsy of right breast subareolar mass on 7/17  Plan:  - I discussed with the patient that I feel that most of her hard tissue underneath the nipple and along the incision site is related to scarring, however I will recommend that we obtain repeat mammography and ultrasound of the area once this is adequately healed to evaluate for any other areas of concern - After discussing case with my partner, I will recommend that we proceed with mammography and ultrasound to the right breast at the beginning of November, which will be 6 months from her last breast imaging and will give us  over 3 months since her  surgery to allow for adequate healing at the incision site - I will plan to order these imaging studies.  I will also discuss having her imaging studies from [facility] pulled over into our system - Follow up with me as needed  All of the above recommendations were discussed with the patient, and all of patient's questions were answered to her expressed satisfaction.  Note: Portions of this report may have been transcribed using voice recognition software. Every effort has been made to ensure accuracy; however, inadvertent computerized transcription errors may still be present.   Dorothyann Brittle, DO [facility] [address] [place] [address], [place] [address] [phone] (office)

## 2024-06-20 ENCOUNTER — Encounter: Admitting: Surgery

## 2024-07-11 ENCOUNTER — Other Ambulatory Visit (HOSPITAL_COMMUNITY): Payer: Self-pay | Admitting: Nurse Practitioner

## 2024-07-11 DIAGNOSIS — Z8249 Family history of ischemic heart disease and other diseases of the circulatory system: Secondary | ICD-10-CM | POA: Diagnosis not present

## 2024-07-11 DIAGNOSIS — Z0189 Encounter for other specified special examinations: Secondary | ICD-10-CM | POA: Diagnosis not present

## 2024-07-18 ENCOUNTER — Ambulatory Visit (HOSPITAL_COMMUNITY)
Admission: RE | Admit: 2024-07-18 | Discharge: 2024-07-18 | Disposition: A | Discharge status: Home / Self Care | Source: Ambulatory Visit | Attending: Internal Medicine | Admitting: Internal Medicine

## 2024-07-18 DIAGNOSIS — Z136 Encounter for screening for cardiovascular disorders: Secondary | ICD-10-CM | POA: Insufficient documentation

## 2024-07-18 DIAGNOSIS — I351 Nonrheumatic aortic (valve) insufficiency: Secondary | ICD-10-CM | POA: Diagnosis not present

## 2024-07-18 DIAGNOSIS — Z8249 Family history of ischemic heart disease and other diseases of the circulatory system: Secondary | ICD-10-CM | POA: Insufficient documentation

## 2024-07-18 DIAGNOSIS — I340 Nonrheumatic mitral (valve) insufficiency: Secondary | ICD-10-CM | POA: Diagnosis not present

## 2024-07-18 LAB — ECHOCARDIOGRAM COMPLETE
Area-P 1/2: 4.68 cm2
S' Lateral: 2 cm

## 2024-07-18 NOTE — Progress Notes (Signed)
*  PRELIMINARY RESULTS* Echocardiogram 2D Echocardiogram has been performed.  Teresa Aida PARAS 07/18/2024, 11:06 AM

## 2024-08-26 ENCOUNTER — Other Ambulatory Visit: Payer: Self-pay | Admitting: Surgery

## 2024-08-26 DIAGNOSIS — N6341 Unspecified lump in right breast, subareolar: Secondary | ICD-10-CM

## 2024-09-13 ENCOUNTER — Inpatient Hospital Stay
Admission: RE | Admit: 2024-09-13 | Discharge: 2024-09-13 | Disposition: A | Payer: Self-pay | Source: Ambulatory Visit | Attending: Surgery

## 2024-09-13 ENCOUNTER — Other Ambulatory Visit: Payer: Self-pay | Admitting: Surgery

## 2024-09-13 DIAGNOSIS — N6341 Unspecified lump in right breast, subareolar: Secondary | ICD-10-CM

## 2024-09-19 DIAGNOSIS — H5203 Hypermetropia, bilateral: Secondary | ICD-10-CM | POA: Diagnosis not present

## 2024-09-23 DIAGNOSIS — I10 Essential (primary) hypertension: Secondary | ICD-10-CM | POA: Diagnosis not present

## 2024-09-23 DIAGNOSIS — R7303 Prediabetes: Secondary | ICD-10-CM | POA: Diagnosis not present

## 2024-09-24 ENCOUNTER — Ambulatory Visit (HOSPITAL_COMMUNITY): Admission: RE | Admit: 2024-09-24 | Discharge: 2024-09-24 | Attending: Surgery

## 2024-09-24 ENCOUNTER — Other Ambulatory Visit (HOSPITAL_COMMUNITY): Payer: Self-pay | Admitting: Internal Medicine

## 2024-09-24 ENCOUNTER — Inpatient Hospital Stay (HOSPITAL_COMMUNITY)
Admission: RE | Admit: 2024-09-24 | Discharge: 2024-09-24 | Disposition: A | Source: Ambulatory Visit | Attending: Surgery

## 2024-09-24 ENCOUNTER — Encounter (HOSPITAL_COMMUNITY): Payer: Self-pay

## 2024-09-24 DIAGNOSIS — N6341 Unspecified lump in right breast, subareolar: Secondary | ICD-10-CM | POA: Diagnosis not present

## 2024-09-24 DIAGNOSIS — R92333 Mammographic heterogeneous density, bilateral breasts: Secondary | ICD-10-CM | POA: Diagnosis not present

## 2024-09-24 DIAGNOSIS — Z1231 Encounter for screening mammogram for malignant neoplasm of breast: Secondary | ICD-10-CM

## 2024-09-25 ENCOUNTER — Other Ambulatory Visit: Payer: Self-pay | Admitting: *Deleted

## 2024-09-25 ENCOUNTER — Other Ambulatory Visit: Payer: Self-pay | Admitting: Surgery

## 2024-09-25 DIAGNOSIS — N6341 Unspecified lump in right breast, subareolar: Secondary | ICD-10-CM

## 2024-09-25 DIAGNOSIS — N63 Unspecified lump in unspecified breast: Secondary | ICD-10-CM

## 2024-10-01 ENCOUNTER — Other Ambulatory Visit (HOSPITAL_COMMUNITY): Payer: Self-pay | Admitting: Nurse Practitioner

## 2024-10-01 DIAGNOSIS — E782 Mixed hyperlipidemia: Secondary | ICD-10-CM

## 2024-10-08 ENCOUNTER — Ambulatory Visit (HOSPITAL_COMMUNITY)
Admission: RE | Admit: 2024-10-08 | Discharge: 2024-10-08 | Disposition: A | Discharge status: Home / Self Care | Source: Ambulatory Visit | Attending: Surgery | Admitting: Surgery

## 2024-10-08 DIAGNOSIS — N63 Unspecified lump in unspecified breast: Secondary | ICD-10-CM | POA: Insufficient documentation

## 2024-10-08 DIAGNOSIS — N6341 Unspecified lump in right breast, subareolar: Secondary | ICD-10-CM | POA: Insufficient documentation

## 2024-10-08 MED ORDER — GADOBUTROL 1 MMOL/ML IV SOLN
6.0000 mL | Freq: Once | INTRAVENOUS | Status: AC | PRN
  Administered 2024-10-08: 6 mL via INTRAVENOUS

## 2024-10-09 ENCOUNTER — Telehealth: Payer: Self-pay | Admitting: Surgery

## 2024-10-09 ENCOUNTER — Ambulatory Visit (HOSPITAL_COMMUNITY)
Admission: RE | Admit: 2024-10-09 | Discharge: 2024-10-09 | Disposition: A | Discharge status: Home / Self Care | Source: Ambulatory Visit | Attending: Nurse Practitioner | Admitting: Nurse Practitioner

## 2024-10-09 DIAGNOSIS — E782 Mixed hyperlipidemia: Secondary | ICD-10-CM | POA: Diagnosis present

## 2024-10-09 DIAGNOSIS — D241 Benign neoplasm of right breast: Secondary | ICD-10-CM

## 2024-10-09 NOTE — Telephone Encounter (Signed)
 [facility]  Called to update the patient on the results of her recent MRI study.  Her MRI demonstrated a benign-appearing nipple adenoma.  The previously visualized non-mass enhancement in the right subareolar location was no longer present, and indicative of the area being removed at the time of excisional biopsy.  Given no other concerning imaging findings on MRI, recommendation was for the patient to proceed with her annual bilateral diagnostic mammogram in May 2026 with a right breast ultrasound to confirm stability of the likely benign right nipple adenoma.  Patient states that she is scheduled for her MRI in March.  I advised that she can call the breast center to push this out to May if she would prefer.  No further need for surgical intervention at this time.  All questions were answered to the patient's expressed satisfaction.  MRI bilateral breast (10/08/2024): IMPRESSION: 1. No residual non-mass enhancement involving the RIGHT subareolar location, indicating that the entire site was removed at the time of excisional biopsy. 2. No MRI evidence of malignancy involving the LEFT breast. 3. 0.5 cm mass involving the base of the RIGHT nipple, corresponding to the prior sonographic findings and likely related to a nipple adenoma.   RECOMMENDATION: RIGHT breast ultrasound at time of annual BILATERAL diagnostic mammography which is due in May, 2026 (in order to confirm stability of the likely benign RIGHT nipple adenoma).   BI-RADS CATEGORY  3: Probably benign.  Cindy Brittle, DO [facility] [address] [place], [place] [address] [phone] (office)

## 2024-10-17 ENCOUNTER — Ambulatory Visit (HOSPITAL_COMMUNITY)
Admission: RE | Admit: 2024-10-17 | Discharge: 2024-10-17 | Disposition: A | Discharge status: Home / Self Care | Payer: Self-pay | Source: Ambulatory Visit | Attending: Nurse Practitioner | Admitting: Nurse Practitioner

## 2024-10-17 DIAGNOSIS — E782 Mixed hyperlipidemia: Secondary | ICD-10-CM | POA: Insufficient documentation

## 2024-10-23 ENCOUNTER — Other Ambulatory Visit: Payer: Self-pay | Admitting: Nurse Practitioner

## 2024-10-23 DIAGNOSIS — N6341 Unspecified lump in right breast, subareolar: Secondary | ICD-10-CM

## 2025-01-06 ENCOUNTER — Ambulatory Visit (HOSPITAL_COMMUNITY)
